# Patient Record
Sex: Male | Born: 1965 | Race: Black or African American | Hispanic: No | Marital: Single | Smoking: Current every day smoker
Health system: Southern US, Community
[De-identification: ages and names within clinical notes are randomized; demographics above are authoritative.]

## PROBLEM LIST (undated history)

## (undated) DIAGNOSIS — R220 Localized swelling, mass and lump, head: Secondary | ICD-10-CM

## (undated) DIAGNOSIS — K219 Gastro-esophageal reflux disease without esophagitis: Secondary | ICD-10-CM

## (undated) DIAGNOSIS — I10 Essential (primary) hypertension: Secondary | ICD-10-CM

## (undated) DIAGNOSIS — T7840XA Allergy, unspecified, initial encounter: Secondary | ICD-10-CM

## (undated) DIAGNOSIS — S8290XA Unspecified fracture of unspecified lower leg, initial encounter for closed fracture: Secondary | ICD-10-CM

## (undated) HISTORY — DX: Essential (primary) hypertension: I10

## (undated) HISTORY — PX: CARPAL TUNNEL RELEASE: SHX101

## (undated) HISTORY — DX: Allergy, unspecified, initial encounter: T78.40XA

---
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
## 2008-11-09 ENCOUNTER — Emergency Department (HOSPITAL_COMMUNITY): Admission: EM | Admit: 2008-11-09 | Discharge: 2008-11-09 | Payer: Self-pay | Admitting: Emergency Medicine

## 2008-12-06 ENCOUNTER — Emergency Department (HOSPITAL_COMMUNITY): Admission: EM | Admit: 2008-12-06 | Discharge: 2008-12-06 | Payer: Self-pay | Admitting: Family Medicine

## 2009-02-16 ENCOUNTER — Ambulatory Visit: Payer: Self-pay | Admitting: Physician Assistant

## 2009-02-16 DIAGNOSIS — R030 Elevated blood-pressure reading, without diagnosis of hypertension: Secondary | ICD-10-CM | POA: Insufficient documentation

## 2009-02-16 DIAGNOSIS — L723 Sebaceous cyst: Secondary | ICD-10-CM | POA: Insufficient documentation

## 2009-02-16 HISTORY — DX: Elevated blood-pressure reading, without diagnosis of hypertension: R03.0

## 2009-02-19 ENCOUNTER — Encounter: Payer: Self-pay | Admitting: Physician Assistant

## 2009-02-19 LAB — CONVERTED CEMR LAB
AST: 41 units/L — ABNORMAL HIGH (ref 0–37)
Albumin: 4.4 g/dL (ref 3.5–5.2)
Basophils Absolute: 0 10*3/uL (ref 0.0–0.1)
Benzodiazepines.: NEGATIVE
Calcium: 9.8 mg/dL (ref 8.4–10.5)
Cocaine Metabolites: POSITIVE — AB
Creatinine, Ser: 1.09 mg/dL (ref 0.40–1.50)
Eosinophils Absolute: 0.2 10*3/uL (ref 0.0–0.7)
Eosinophils Relative: 3 % (ref 0–5)
Lymphocytes Relative: 38 % (ref 12–46)
Marijuana Metabolite: POSITIVE — AB
Methadone: NEGATIVE
Monocytes Relative: 10 % (ref 3–12)
Neutro Abs: 3.4 10*3/uL (ref 1.7–7.7)
Neutrophils Relative %: 49 % (ref 43–77)
Platelets: 182 10*3/uL (ref 150–400)
Potassium: 5 meq/L (ref 3.5–5.3)
RBC: 5.05 M/uL (ref 4.22–5.81)
Sodium: 143 meq/L (ref 135–145)
TSH: 1.166 microintl units/mL (ref 0.350–4.500)
Total Bilirubin: 0.3 mg/dL (ref 0.3–1.2)

## 2009-03-12 ENCOUNTER — Telehealth: Payer: Self-pay | Admitting: Physician Assistant

## 2009-03-19 ENCOUNTER — Encounter: Payer: Self-pay | Admitting: Physician Assistant

## 2009-03-20 ENCOUNTER — Encounter (INDEPENDENT_AMBULATORY_CARE_PROVIDER_SITE_OTHER): Payer: Self-pay | Admitting: *Deleted

## 2009-04-14 ENCOUNTER — Ambulatory Visit: Payer: Self-pay | Admitting: Internal Medicine

## 2009-04-14 ENCOUNTER — Encounter: Payer: Self-pay | Admitting: Physician Assistant

## 2009-04-28 ENCOUNTER — Ambulatory Visit: Payer: Self-pay | Admitting: Physician Assistant

## 2009-04-28 DIAGNOSIS — Z9189 Other specified personal risk factors, not elsewhere classified: Secondary | ICD-10-CM | POA: Insufficient documentation

## 2009-04-28 DIAGNOSIS — B351 Tinea unguium: Secondary | ICD-10-CM | POA: Insufficient documentation

## 2009-04-28 DIAGNOSIS — F17200 Nicotine dependence, unspecified, uncomplicated: Secondary | ICD-10-CM | POA: Insufficient documentation

## 2009-04-29 LAB — CONVERTED CEMR LAB
ALT: 35 units/L (ref 0–53)
BUN: 12 mg/dL (ref 6–23)
Calcium: 9.9 mg/dL (ref 8.4–10.5)
Chloride: 103 meq/L (ref 96–112)
Cholesterol: 159 mg/dL (ref 0–200)
Glucose, Bld: 94 mg/dL (ref 70–99)
HDL goal, serum: 40 mg/dL
LDL Goal: 130 mg/dL
Potassium: 5.9 meq/L — ABNORMAL HIGH (ref 3.5–5.3)
Total Bilirubin: 0.4 mg/dL (ref 0.3–1.2)
Total CHOL/HDL Ratio: 5.3
Total Protein: 7.8 g/dL (ref 6.0–8.3)

## 2009-05-01 ENCOUNTER — Encounter (INDEPENDENT_AMBULATORY_CARE_PROVIDER_SITE_OTHER): Payer: Self-pay | Admitting: *Deleted

## 2009-05-04 ENCOUNTER — Ambulatory Visit: Payer: Self-pay | Admitting: Physician Assistant

## 2009-05-04 LAB — CONVERTED CEMR LAB

## 2009-05-05 ENCOUNTER — Encounter (INDEPENDENT_AMBULATORY_CARE_PROVIDER_SITE_OTHER): Payer: Self-pay | Admitting: *Deleted

## 2009-05-05 LAB — CONVERTED CEMR LAB: Potassium: 4.4 meq/L (ref 3.5–5.3)

## 2009-09-12 ENCOUNTER — Emergency Department (HOSPITAL_COMMUNITY): Admission: EM | Admit: 2009-09-12 | Discharge: 2009-09-12 | Payer: Self-pay | Admitting: Emergency Medicine
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
## 2010-03-17 NOTE — Assessment & Plan Note (Signed)
Summary: F/U/FASTING   Vital Signs:  Patient profile:   45 year old male Height:      64.25 inches Weight:      259 pounds BMI:     44.27 Temp:     98.3 degrees F oral Pulse rate:   83 / minute Pulse rhythm:   regular Resp:     18 per minute BP sitting:   123 / 89  (left arm) Cuff size:   large  Vitals Entered By: Arm[place] Shannon (April 28, 2009 3:55 PM) CC: cpe.... Is Patient Diabetic? No Pain Assessment Patient in pain? no       Does patient need assistance? Functional Status Self care Ambulation Normal   Primary Care Provider:  Tereso Newcomer, PA-C  CC:  cpe.....  History of Present Illness: Here for CPE.  Very upset for waiting so long. Put in 15 min slot. Not checked in at right time  Epidermoid cyst on scalp:  Saw derm clinic at [facility].  Did not like the dermatologist he saw.  She tried to discourage him from having removed.  However, he wants it excised.  Also has h/o tinea pedis.  Saw a foot doctor in past and had some type of surgery.  Has tried multiple creams, etc. without relief.  Pruritic.    Allergies: No Known Drug Allergies  Past History:  Past Medical History: Last updated: 02/19/2009 Unremarkable Substance Abuse   a.  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 CV:  Denies chest pain or  discomfort, fainting, and shortness of breath with exertion. Resp:  Denies cough. GI:  Denies bloody stools and dark tarry stools. GU:  Denies dysuria, hematuria, nocturia, urinary frequency, and urinary hesitancy. MS:  Denies joint pain. Derm:  bilat tinea bilat thickened yellow nails. Psych:  Denies depression. Endo:  Denies cold intolerance.  Physical Exam  General:  alert, well-developed, and well-nourished.   Head:  normocephalic and atraumatic.   Eyes:  pupils equal, pupils round, pupils reactive to light, and no retinal abnormalitiies.   Ears:  R ear normal and L ear normal.   Nose:  no external deformity.   Mouth:  pharynx pink and moist, no erythema, and no exudates.   Neck:  supple, no thyromegaly, no carotid bruits, and no cervical lymphadenopathy.   Chest Wall:  no deformities.   Breasts:  no gynecomastia.   Lungs:  normal breath sounds, no crackles, and no wheezes.   Heart:  normal rate, regular rhythm, and no murmur.   Abdomen:  soft, non-tender, normal bowel sounds, and no hepatomegaly.   Rectal:  no external abnormalities.   Genitalia:  circumcised, no hydrocele, no varicocele, no scrotal masses, no testicular masses or atrophy, no cutaneous lesions, and no urethral discharge.   Msk:  normal ROM.    Pulses:  DP/PT 2+ bilat Extremities:  no edema  Neurologic:  alert & oriented X3 and cranial nerves II-XII intact.   Skin:  large epidermoid cyst noted right frontal area  bilat foot scaling c/w tinea bilat toenails with onychomycosis  Psych:  normally interactive and good eye contact.     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 Take 1 tablet by mouth once a day  Problem # 5:  PREVENTIVE HEALTH CARE (ICD-V70.0)  check lipids  Orders: T-Lipid Profile ([id number])  Complete Medication List: 1)  Lamisil 250 Mg Tabs (Terbinafine hcl) .... Take 1 tablet by mouth once a day   Patient Instructions: 1)  Need HIV results from last visit. 2)  Return to clinic in 8 weeks for hepatic function panel (Dx :110.1, V58.69) Prescriptions: LAMISIL 250 MG TABS (TERBINAFINE HCL) Take 1 tablet by mouth once a day  #30 x 2   Entered and Authorized by:   Tereso Newcomer PA-C   Signed by:   Tereso Newcomer PA-C on 04/28/2009   Method used:   Print then Give to Patient   RxID:   [id number]

## 2010-03-17 NOTE — Letter (Signed)
Summary: *HSN Results Follow up  [facility]  [address] [place], [place] [address]   Phone: [phone]  Fax: [phone]      05/05/2009   Richard Sexton [address] [place], [place]  [address]   Dear  Mr. [name],                            ____S.Drinkard,FNP   ____D. Gore,FNP       ____B. McPherson,MD   ____V. Rankins,MD    ____E. Mulberry,MD    ____N. Daphine Deutscher, FNP  ____D. Reche Dixon, MD    ____K. Philipp Deputy, MD    ____Other     This letter is to inform you that your recent test(s):  _______Pap Smear    __X_____Lab Test     _______X-ray    ___X____ is within acceptable limits  _______ requires a medication change  _______ requires a follow-up lab visit  _______ requires a follow-up visit with your provider   Comments:       _________________________________________________________ If you have any questions, please contact our office                     Sincerely,  Armenia Shannon [facility]

## 2010-03-17 NOTE — Letter (Signed)
Summary: *HSN Results Follow up  [facility]  [address] [place], [place] [address]   Phone: [phone]  Fax: [phone]      05/01/2009   EAMON TANTILLO [address] [place], [place]  [address]   Dear  Mr. HASSAN BLACKSHIRE,                            ____S.Drinkard,FNP   ____D. Gore,FNP       ____B. McPherson,MD   ____V. Rankins,MD    ____E. Mulberry,MD    ____N. Daphine Deutscher, FNP  ____D. Reche Dixon, MD    ____K. Philipp Deputy, MD    ____Other     This letter is to inform you that your recent test(s):  _______Pap Smear    ___X____Lab Test     _______X-ray    _______ is within acceptable limits  __X_____ requires a medication change  ___X____ requires a follow-up lab visit  _______ requires a follow-up visit with your provider   Comments:  We have been trying to contact you.  Please give the office a call at your earliest convenience.       _________________________________________________________ If you have any questions, please contact our office                     Sincerely,  Armenia Shannon [facility]

## 2010-03-17 NOTE — Progress Notes (Signed)
Summary: Derm referral update  Phone Note From Other Clinic   Caller: Referral Coordinator Reason for Call: Schedule Patient Appt Summary of Call: None of the numbers in IDX work for the Pt.I would suggest that when & if  the Pt comes in to see you in March that he be scheduled in [facility] Derm clinic for a consult.It would be more cost effective for the Pt.Let me know what you think about this. Initial call taken by: Candi Leash,  March 12, 2009 10:16 AM  Follow-up for Phone Call        Please send letter for patient to get in touch with [place].   Follow-up by: Tereso Newcomer PA-C,  March 12, 2009 10:36 AM  Additional Follow-up for Phone Call Additional follow up Details #1::        i don't this would be a problem for the pt.... i callled the above number and left a message...Marland KitchenMarland KitchenArmenia Shannon  March 12, 2009 11:15 AM  mailing letter.Armenia Shannon  March 20, 2009 9:29 AM

## 2010-03-17 NOTE — Letter (Signed)
Summary: *HSN Results Follow up  [facility]  [address] [place], [place] [address]   Phone: [phone]  Fax: [phone]      02/19/2009   Richard Sexton [address] [place], [place]  [address]   Dear  Mr. Richard Sexton,                            ____S.Drinkard,FNP   ____D. Gore,FNP       ____B. McPherson,MD   ____V. Rankins,MD    ____E. Mulberry,MD    ____N. Daphine Deutscher, FNP  ____D. Reche Dixon, MD    ____K. Philipp Deputy, MD    __x__S. Alben Spittle, PA-C     This letter is to inform you that your recent test(s):  _______Pap Smear    ____x___Lab Test     _______X-ray    ___x____ is within acceptable limits  _______ requires a medication change  _______ requires a follow-up lab visit  ___x____ requires a follow-up visit with your provider   Comments:  Make sure you keep your appointment for your physical exam.       _________________________________________________________ If you have any questions, please contact our office                     Sincerely,  Tereso Newcomer PA-C [facility]

## 2010-03-17 NOTE — Letter (Signed)
Summary: SLIDING SCALE FEE  SLIDING SCALE FEE   Imported By: Arta Bruce 04/27/2009 16:44:45  _____________________________________________________________________  External Attachment:    Type:   Image     Comment:   External Document

## 2010-03-17 NOTE — Letter (Signed)
Summary: DERMATOLOGY  DERMATOLOGY   Imported By: Arta Bruce 07/06/2009 15:54:09  _____________________________________________________________________  External Attachment:    Type:   Image     Comment:   External Document

## 2010-03-17 NOTE — Letter (Signed)
Summary: PT INFORMATION SHEET  PT INFORMATION SHEET   Imported By: Arta Bruce 04/27/2009 16:14:31  _____________________________________________________________________  External Attachment:    Type:   Image     Comment:   External Document

## 2010-03-17 NOTE — Letter (Signed)
Summary: *HSN Results Follow up  [facility]  [address] [place], [place] [address]   Phone: [phone]  Fax: [phone]      03/20/2009   Richard Sexton [address] [place], [place]  [address]   Dear  Mr. Richard Sexton,                            ____S.Drinkard,FNP   ____D. Gore,FNP       ____B. McPherson,MD   ____V. Rankins,MD    ____E. Mulberry,MD    ____N. Daphine Deutscher, FNP  ____D. Reche Dixon, MD    ____K. Philipp Deputy, MD    ____Other     This letter is to inform you that your recent test(s):  _______Pap Smear    _______Lab Test     _______X-ray    _______ is within acceptable limits  _______ requires a medication change  _______ requires a follow-up lab visit  _______ requires a follow-up visit with your provider   Comments:  We have been trying to contact you.  Please give the office a call at your earliest convenience.       _________________________________________________________ If you have any questions, please contact our office                     Sincerely,  Richard Sexton [facility]

## 2010-03-17 NOTE — Letter (Signed)
Summary: REFERRAL/DERMATOLOGY  REFERRAL/DERMATOLOGY   Imported By: Arta Bruce 07/06/2009 16:04:56  _____________________________________________________________________  External Attachment:    Type:   Image     Comment:   External Document

## 2010-05-21 LAB — URINALYSIS, ROUTINE W REFLEX MICROSCOPIC
Bilirubin Urine: NEGATIVE
Glucose, UA: NEGATIVE mg/dL
Ketones, ur: NEGATIVE mg/dL
Nitrite: POSITIVE — AB
Protein, ur: 30 mg/dL — AB
Specific Gravity, Urine: 1.019 (ref 1.005–1.030)
Urobilinogen, UA: 2 mg/dL — ABNORMAL HIGH (ref 0.0–1.0)
pH: 7 (ref 5.0–8.0)

## 2010-05-21 LAB — URINE CULTURE: Colony Count: >=100000

## 2010-05-21 LAB — URINE MICROSCOPIC-ADD ON

## 2013-06-01 ENCOUNTER — Emergency Department (HOSPITAL_COMMUNITY)
Admission: EM | Admit: 2013-06-01 | Discharge: 2013-06-01 | Disposition: A | Discharge status: Home / Self Care | Payer: No Typology Code available for payment source | Attending: Emergency Medicine | Admitting: Emergency Medicine

## 2013-06-01 ENCOUNTER — Encounter (HOSPITAL_COMMUNITY): Payer: Self-pay | Admitting: Emergency Medicine

## 2013-06-01 DIAGNOSIS — F17200 Nicotine dependence, unspecified, uncomplicated: Secondary | ICD-10-CM | POA: Insufficient documentation

## 2013-06-01 DIAGNOSIS — W57XXXA Bitten or stung by nonvenomous insect and other nonvenomous arthropods, initial encounter: Secondary | ICD-10-CM

## 2013-06-01 DIAGNOSIS — Z23 Encounter for immunization: Secondary | ICD-10-CM | POA: Insufficient documentation

## 2013-06-01 DIAGNOSIS — R000 Tachycardia, unspecified: Secondary | ICD-10-CM | POA: Insufficient documentation

## 2013-06-01 DIAGNOSIS — Y9389 Activity, other specified: Secondary | ICD-10-CM | POA: Insufficient documentation

## 2013-06-01 DIAGNOSIS — IMO0001 Reserved for inherently not codable concepts without codable children: Secondary | ICD-10-CM | POA: Insufficient documentation

## 2013-06-01 DIAGNOSIS — Y9289 Other specified places as the place of occurrence of the external cause: Secondary | ICD-10-CM | POA: Insufficient documentation

## 2013-06-01 MED ORDER — HYDROCORTISONE 1 % EX CREA: TOPICAL_CREAM | CUTANEOUS | Status: DC

## 2013-06-01 MED ORDER — CETIRIZINE HCL 10 MG PO TABS: 10.0000 mg | ORAL_TABLET | Freq: Every day | ORAL | Status: DC

## 2013-06-01 MED ORDER — DIPHENHYDRAMINE HCL 25 MG PO CAPS
25.0000 mg | ORAL_CAPSULE | Freq: Once | ORAL | Status: AC
  Administered 2013-06-01: 25 mg via ORAL
  Filled 2013-06-01: qty 1

## 2013-06-01 MED ORDER — TETANUS-DIPHTH-ACELL PERTUSSIS 5-2.5-18.5 LF-MCG/0.5 IM SUSP
0.5000 mL | Freq: Once | INTRAMUSCULAR | Status: AC
  Administered 2013-06-01: 0.5 mL via INTRAMUSCULAR
  Filled 2013-06-01: qty 0.5

## 2013-06-01 NOTE — ED Provider Notes (Signed)
CSN: [id number]     Arrival date & time 06/01/13  1746 History  This chart was scribed for non-physician practitioner working with Shaune Pollack, MD by Mercy Moore, ED Scribe. This patient was seen in room TR07C/TR07C and the patient's care was started at 6:05 PM.   Chief Complaint  Patient presents with  . Insect Bite      The history is provided by the patient. No language interpreter was used.   HPI Comments: HPI Comments: Richard Sexton is a 48 y.o. male brought in by ambulance, who presents to the Emergency Department with an insect bit on his left forearm. Patient was bitten 30 minutes prior to arrival to the ED while [place] a [employer] bus. Patient reports relaxing on the bus with his arm resting on a seat and being bitten on the left forearm. Patient is unsure of the type of insect that bit him. Patient is uncertain of last Tetanus and is due for an update.   History reviewed. No pertinent past medical history. History reviewed. No pertinent past surgical history. No family history on file. History  Substance Use Topics  . Smoking status: Current Every Day Smoker  . Smokeless tobacco: Not on file  . Alcohol Use: Yes    Review of Systems  Constitutional: Negative for fever and chills.  Respiratory: Negative for cough, shortness of breath and wheezing.   Skin: Positive for rash.       Insect bite.       Allergies  Review of patient's allergies indicates no known allergies.  Home Medications   Prior to Admission medications   Not on File   Triage Vitals: BP 136/96  Pulse 109  Temp(Src) 99.4 F (37.4 C) (Oral)  Resp 18  Ht 5\' 6"  (1.676 m)  Wt 247 lb (112.038 kg)  BMI 39.89 kg/m2  SpO2 98% Physical Exam  Nursing note and vitals reviewed. Constitutional: He is oriented to person, place, and time. He appears well-developed and well-nourished. No distress.  HENT:  Head: Normocephalic and atraumatic.  No swelling, drooling, or stridor, airway is patent  Eyes: EOM  are normal.  Neck: Neck supple. No tracheal deviation present.  Cardiovascular:  Mildly tachycardic  Pulmonary/Chest: Effort normal. No respiratory distress. He has no wheezes.  Musculoskeletal: Normal range of motion.  Neurological: He is alert and oriented to person, place, and time.  Skin: Skin is warm and dry.  3 bug bites on left anterior arm, no discharge, no evidence of cellulitis  Psychiatric: He has a normal mood and affect. His behavior is normal.    ED Course  Procedures (including critical care time) DIAGNOSTIC STUDIES: Oxygen Saturation is 98% on room air, normal by my interpretation.    COORDINATION OF CARE: 6:05 PM- Advised to treat bit with Benadryl, Claritin, and/or hydrocortisone cream. Patient advised to revisit ED if the bit spreads, begins to drains or appears infected. Discussed treatment plan with patient at bedside and patient agreed to plan.    Labs Review Labs Reviewed - No data to display  Imaging Review No results found.   EKG Interpretation None      MDM   Final diagnoses:  Insect bite    Patient with rash/insect bite.  Will treat with benadryl, zyrtec, and hydrocortisone.  Return precautions given regarding infections.  Patient is stable and ready for discharge.  I personally performed the services described in this documentation, which was scribed in my presence. The recorded information has been reviewed and is accurate.  Montine Circle, PA-C 06/01/13 1840

## 2013-06-01 NOTE — Discharge Instructions (Signed)
Insect Bite  Mosquitoes, flies, fleas, bedbugs, and many other insects can bite. Insect bites are different from insect stings. A sting is when venom is injected into the skin. Some insect bites can transmit infectious diseases.  SYMPTOMS   Insect bites usually turn red, swell, and itch for 2 to 4 days. They often go away on their own.  TREATMENT   Your caregiver may prescribe antibiotic medicines if a bacterial infection develops in the bite.  HOME CARE INSTRUCTIONS   Do not scratch the bite area.   Keep the bite area clean and dry. Wash the bite area thoroughly with soap and water.   Put ice or cool compresses on the bite area.   Put ice in a plastic bag.   Place a towel between your skin and the bag.   Leave the ice on for 20 minutes, 4 times a day for the first 2 to 3 days, or as directed.   You may apply a baking soda paste, cortisone cream, or calamine lotion to the bite area as directed by your caregiver. This can help reduce itching and swelling.   Only take over-the-counter or prescription medicines as directed by your caregiver.   If you are given antibiotics, take them as directed. Finish them even if you start to feel better.  You may need a tetanus shot if:   You cannot remember when you had your last tetanus shot.   You have never had a tetanus shot.   The injury broke your skin.  If you get a tetanus shot, your arm may swell, get red, and feel warm to the touch. This is common and not a problem. If you need a tetanus shot and you choose not to have one, there is a rare chance of getting tetanus. Sickness from tetanus can be serious.  SEEK IMMEDIATE MEDICAL CARE IF:    You have increased pain, redness, or swelling in the bite area.   You see a red line on the skin coming from the bite.   You have a fever.   You have joint pain.   You have a headache or neck pain.   You have unusual weakness.   You have a rash.   You have chest pain or shortness of breath.    You have abdominal pain, nausea, or vomiting.   You feel unusually tired or sleepy.  MAKE SURE YOU:    Understand these instructions.   Will watch your condition.   Will get help right away if you are not doing well or get worse.  Document Released: 03/10/2004 Document Revised: 04/25/2011 Document Reviewed: 09/01/2010  [employer] Patient Information 2014 [employer].

## 2013-06-01 NOTE — ED Notes (Signed)
Received pt via EMS with c/o Pt bit by insect on left forearm onset today about 30 mins PTA.

## 2013-06-02 NOTE — ED Provider Notes (Signed)
History/physical exam/procedure(s) were performed by non-physician practitioner and as supervising physician I was immediately available for consultation/collaboration. I have reviewed all notes and am in agreement with care and plan.   Shaune Pollack, MD 06/02/13 [phone]

## 2013-08-20 ENCOUNTER — Ambulatory Visit: Payer: No Typology Code available for payment source | Admitting: Internal Medicine

## 2013-08-20 DIAGNOSIS — Z0289 Encounter for other administrative examinations: Secondary | ICD-10-CM

## 2014-11-20 ENCOUNTER — Encounter (HOSPITAL_COMMUNITY): Payer: Self-pay | Admitting: Emergency Medicine

## 2014-11-20 ENCOUNTER — Emergency Department (HOSPITAL_COMMUNITY)
Admission: EM | Admit: 2014-11-20 | Discharge: 2014-11-20 | Disposition: A | Discharge status: Home / Self Care | Payer: No Typology Code available for payment source | Attending: Emergency Medicine | Admitting: Emergency Medicine

## 2014-11-20 DIAGNOSIS — Z79899 Other long term (current) drug therapy: Secondary | ICD-10-CM | POA: Insufficient documentation

## 2014-11-20 DIAGNOSIS — Z720 Tobacco use: Secondary | ICD-10-CM | POA: Insufficient documentation

## 2014-11-20 DIAGNOSIS — L03115 Cellulitis of right lower limb: Secondary | ICD-10-CM | POA: Insufficient documentation

## 2014-11-20 DIAGNOSIS — B353 Tinea pedis: Secondary | ICD-10-CM | POA: Insufficient documentation

## 2014-11-20 LAB — CBG MONITORING, ED: Glucose-Capillary: 107 mg/dL — ABNORMAL HIGH (ref 65–99)

## 2014-11-20 MED ORDER — DOXYCYCLINE HYCLATE 100 MG PO CAPS: 100.0000 mg | ORAL_CAPSULE | Freq: Two times a day (BID) | ORAL | Status: DC

## 2014-11-20 MED ORDER — CLOTRIMAZOLE-BETAMETHASONE 1-0.05 % EX CREA: TOPICAL_CREAM | CUTANEOUS | Status: DC

## 2014-11-20 NOTE — Discharge Instructions (Signed)
Athlete's Foot Athlete's foot (tinea pedis) is a fungal infection of the skin on the feet. It often occurs on the skin between the toes or underneath the toes. It can also occur on the soles of the feet. Athlete's foot is more likely to occur in hot, humid weather. Not washing your feet or changing your socks often enough can contribute to athlete's foot. The infection can spread from person to person (contagious). CAUSES Athlete's foot is caused by a fungus. This fungus thrives in warm, moist places. Most people get athlete's foot by sharing shower stalls, towels, and wet floors with an infected person. People with weakened immune systems, including those with diabetes, may be more likely to get athlete's foot. SYMPTOMS   Itchy areas between the toes or on the soles of the feet.  White, flaky, or scaly areas between the toes or on the soles of the feet.  Tiny, intensely itchy blisters between the toes or on the soles of the feet.  Tiny cuts on the skin. These cuts can develop a bacterial infection.  Thick or discolored toenails. DIAGNOSIS  Your caregiver can usually tell what the problem is by doing a physical exam. Your caregiver may also take a skin sample from the rash area. The skin sample may be examined under a microscope, or it may be tested to see if fungus will grow in the sample. A sample may also be taken from your toenail for testing. TREATMENT  Over-the-counter and prescription medicines can be used to kill the fungus. These medicines are available as powders or creams. Your caregiver can suggest medicines for you. Fungal infections respond slowly to treatment. You may need to continue using your medicine for several weeks. PREVENTION   Do not share towels.  Wear sandals in wet areas, such as shared locker rooms and shared showers.  Keep your feet dry. Wear shoes that allow air to circulate. Wear cotton or wool socks. HOME CARE INSTRUCTIONS   Take medicines as directed by  your caregiver. Do not use steroid creams on athlete's foot.  Keep your feet clean and cool. Wash your feet daily and dry them thoroughly, especially between your toes.  Change your socks every day. Wear cotton or wool socks. In hot climates, you may need to change your socks 2 to 3 times per day.  Wear sandals or canvas tennis shoes with good air circulation.  If you have blisters, soak your feet in Burow's solution or Epsom salts for 20 to 30 minutes, 2 times a day to dry out the blisters. Make sure you dry your feet thoroughly afterward. SEEK MEDICAL CARE IF:   You have a fever.  You have swelling, soreness, warmth, or redness in your foot.  You are not getting better after 7 days of treatment.  You are not completely cured after 30 days.  You have any problems caused by your medicines. MAKE SURE YOU:   Understand these instructions.  Will watch your condition.  Will get help right away if you are not doing well or get worse.   This information is not intended to replace advice given to you by your health care provider. Make sure you discuss any questions you have with your health care provider.   Document Released: 01/29/2000 Document Revised: 04/25/2011 Document Reviewed: 08/04/2014 Elsevier Interactive Patient Education 2016 [place] in warm salt water daily. Keep feet dry. Prescriptions for ointment and antibiotic. Elevate feet.

## 2014-11-20 NOTE — ED Notes (Addendum)
CBG 107mg /dL

## 2014-11-20 NOTE — ED Notes (Signed)
Declined W/C at D/C and was escorted to lobby by RN. 

## 2014-11-20 NOTE — ED Provider Notes (Signed)
CSN: [id number]     Arrival date & time 11/20/14  1048 History   By signing my name below, I, Erling Conte, attest that this documentation has been prepared under the direction and in the presence of No att. providers found. Electronically Signed: Erling Conte, ED Scribe. 11/20/2014. 3:34 PM.    Chief Complaint  Patient presents with  . Foot Pain  . Wound Check    The history is provided by the patient. No language interpreter was used.    HPI Comments: Richard Sexton is a 49 y.o. male with h/o athletes foot who presents to the Emergency Department complaining of constant, right, pinky toe pain located between his 4th and 5th toes onset several days. He states he has associated flaky rash and abrasion between all his toes but that it is worse between the right 4th and 5th toe. He reports he applied athlete's foot cream to the area with no relief. He denies h/o diabetes and states that he has has had his sugar checked in the past. 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 Pt denies any other complaints at this time.    History reviewed. No pertinent past medical history. History reviewed. No pertinent past surgical history. History reviewed. No pertinent family history. Social History  Substance Use Topics  . Smoking status: Current Every Day Smoker  . Smokeless tobacco: None  . Alcohol Use: Yes    Review of Systems 10 Systems reviewed and all are negative for acute change except as noted in the HPI.     Allergies  Bactrim  Home Medications   Prior to Admission medications   Medication Sig Start Date End Date Taking? Authorizing Provider  cetirizine (ZYRTEC ALLERGY) 10 MG tablet Take 1 tablet (10 mg total) by mouth daily. 06/01/13   Montine Circle, PA-C  clotrimazole-betamethasone (LOTRISONE) cream Apply to affected area 2 times daily prn 11/20/14   Nat Christen, MD  doxycycline (VIBRAMYCIN) 100 MG  capsule Take 1 capsule (100 mg total) by mouth 2 (two) times daily. 11/20/14   Nat Christen, MD  hydrocortisone cream 1 % Apply to affected area 2 times daily 06/01/13   Montine Circle, PA-C   Triage Vitals: BP 141/86 mmHg  Pulse 117  Temp(Src) 99.5 F (37.5 C) (Oral)  Resp 17  Ht 5\' 7"  (1.702 m)  Wt 238 lb (107.956 kg)  BMI 37.27 kg/m2  SpO2 96%  Physical Exam  Constitutional: He is oriented to person, place, and time. He appears well-developed and well-nourished.  HENT:  Head: Normocephalic and atraumatic.  Eyes: Conjunctivae and EOM are normal. Pupils are equal, round, and reactive to light.  Neck: Normal range of motion. Neck supple.  Cardiovascular: Normal rate and regular rhythm.   Pulmonary/Chest: Effort normal and breath sounds normal.  Abdominal: Soft. Bowel sounds are normal.  Musculoskeletal: Normal range of motion.  Tender along the baby toe of right foot around the MTP joint on the plantar aspect and also the lateral aspect of baby toe.  He has a flaky excoriation between all toes but worse along right 4th and 5th digit.  Neurological: He is alert and oriented to person, place, and time.  Skin: Skin is warm and dry.  Psychiatric: He has a normal mood and affect. His behavior is normal.  Nursing note and vitals reviewed.   ED Course  Procedures (including critical care time)  DIAGNOSTIC STUDIES: Oxygen Saturation is 96%  on RA, normal by my interpretation.    COORDINATION OF CARE:  12:04 PM- Will order CBG. Pt advised of plan for treatment and pt agrees.    Labs Review Labs Reviewed  CBG MONITORING, ED - Abnormal; Notable for the following:    Glucose-Capillary 107 (*)    All other components within normal limits    Imaging Review No results found. I have personally reviewed and evaluated these lab results as part of my medical decision-making.   EKG Interpretation None      MDM   Final diagnoses:  Cellulitis of right foot  Tinea pedis of right  foot   History of physical consistent with cellulitis of his right foot and tinea pedis. Discharge medications Lotrisone cream and doxycycline 100 mg.  Glucose normal.  I, Francee Setzer, personally performed the services described in this documentation. All medical record entries made by the scribe were at my direction and in my presence.  I have reviewed the chart and discharge instructions and agree that the record reflects my personal performance and is accurate and complete. Anely Spiewak.  11/20/2014. 3:34 PM.     Nat Christen, MD 11/20/14 1535

## 2014-11-20 NOTE — ED Notes (Signed)
Pt c/o right pinky toe pain and wound x several days; pt sts hx of athletes foot

## 2014-12-04 ENCOUNTER — Encounter (HOSPITAL_COMMUNITY): Payer: Self-pay | Admitting: *Deleted

## 2014-12-04 ENCOUNTER — Emergency Department (HOSPITAL_COMMUNITY)
Admission: EM | Admit: 2014-12-04 | Discharge: 2014-12-04 | Disposition: A | Discharge status: Home / Self Care | Payer: No Typology Code available for payment source | Attending: Physician Assistant | Admitting: Physician Assistant

## 2014-12-04 DIAGNOSIS — L089 Local infection of the skin and subcutaneous tissue, unspecified: Secondary | ICD-10-CM | POA: Insufficient documentation

## 2014-12-04 DIAGNOSIS — Z79899 Other long term (current) drug therapy: Secondary | ICD-10-CM | POA: Insufficient documentation

## 2014-12-04 DIAGNOSIS — Z720 Tobacco use: Secondary | ICD-10-CM | POA: Insufficient documentation

## 2014-12-04 DIAGNOSIS — S8290XA Unspecified fracture of unspecified lower leg, initial encounter for closed fracture: Secondary | ICD-10-CM | POA: Insufficient documentation

## 2014-12-04 DIAGNOSIS — Z87828 Personal history of other (healed) physical injury and trauma: Secondary | ICD-10-CM | POA: Insufficient documentation

## 2014-12-04 HISTORY — DX: Unspecified fracture of unspecified lower leg, initial encounter for closed fracture: S82.90XA

## 2014-12-04 MED ORDER — CEPHALEXIN 500 MG PO CAPS: 500.0000 mg | ORAL_CAPSULE | Freq: Four times a day (QID) | ORAL | Status: DC

## 2014-12-04 NOTE — ED Provider Notes (Signed)
CSN: [id number]     Arrival date & time 12/04/14  7062 History  By signing my name below, I, Erling Conte, attest that this documentation has been prepared under the direction and in the presence of Delos Haring, PA-C Electronically Signed: Erling Conte, ED Scribe. 12/04/2014. 10:20 AM.    Chief Complaint  Patient presents with  . Foot Problem   The history is provided by the patient and medical records. No language interpreter was used.    HPI Comments: Richard Sexton is a 49 y.o. male who presents to the Emergency Department complaining of constant, right toe pain located between his 4th and 5th toes onset 3 weeks. He reports associated flaky rash and mild swelling. Pt was seen for the same 2 weeks ago and was dx with tinea pedia and given rx for doxycyline and Lotrisone cream. Pt reports he was unable to get his doxycycline rx filled because it was too expensive. He has been applying the Lotrisone cream with no relief for his symptoms. Pt's glucose levels were checked 2 weeks ago and came back normal. 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 He does not have a PCP at this time and is requesting a referral to [facility]. No fever and no worsening pain. He denies any other complaints at this time.  Past Medical History  Diagnosis Date  . Broken leg 1975    no surgery ,PT was casted - LT leg   Past Surgical History  Procedure Laterality Date  . Leg surgery Right 1986    Femur shorten   History reviewed. No pertinent family history. Social History  Substance Use Topics  . Smoking status: Current Every Day Smoker  . Smokeless tobacco: Never Used  . Alcohol Use: Yes     Comment: social    Review of Systems  Skin: Positive for color change and rash.  All other systems reviewed and are negative.  Allergies  Bactrim  Home Medications   Prior to Admission medications   Medication Sig  Start Date End Date Taking? Authorizing Provider  cephALEXin (KEFLEX) 500 MG capsule Take 1 capsule (500 mg total) by mouth 4 (four) times daily. 12/04/14   Shaquela Weichert Carlota Raspberry, PA-C  cetirizine (ZYRTEC ALLERGY) 10 MG tablet Take 1 tablet (10 mg total) by mouth daily. 06/01/13   Montine Circle, PA-C  clotrimazole-betamethasone (LOTRISONE) cream Apply to affected area 2 times daily prn 11/20/14   Nat Christen, MD  doxycycline (VIBRAMYCIN) 100 MG capsule Take 1 capsule (100 mg total) by mouth 2 (two) times daily. 11/20/14   Nat Christen, MD  hydrocortisone cream 1 % Apply to affected area 2 times daily 06/01/13   Montine Circle, PA-C   Triage Vitals: BP 136/97 mmHg  Pulse 93  Temp(Src) 98.6 F (37 C) (Oral)  Resp 12  SpO2 99%  Physical Exam  Constitutional: He is oriented to person, place, and time. He appears well-developed and well-nourished. No distress.  HENT:  Head: Normocephalic and atraumatic.  Eyes: Conjunctivae and EOM are normal.  Neck: Neck supple. No tracheal deviation present.  Cardiovascular: Normal rate.   Pulmonary/Chest: Effort normal. No respiratory distress.  Musculoskeletal: Normal range of motion.  Tender along the little toe of right foot around the MTP joint on the plantar and lateral aspect   Neurological: He is alert and oriented to person, place, and time.  Skin: Skin is warm and dry.  He has a flaky excoriation between all toes that is greater along right 4th and 5th digit.   Psychiatric: He has a normal mood and affect. His behavior is normal.  Nursing note and vitals reviewed.   ED Course  Procedures (including critical care time)  DIAGNOSTIC STUDIES: Oxygen Saturation is 99% on RA, normal by my interpretation.    COORDINATION OF CARE:  10:18 AM- Will d/c pt with rx for Keflex and confirmed it is on the [facility] $4 list. He was unable to afford the Doxy even with a coupon for [facility]. I had planned to write him for Bactrim but he is allergic. Given referral to  [facility].  Will also provide with referral to [facility]. Pt advised of plan for treatment and pt agrees.    Labs Review Labs Reviewed - No data to display  Imaging Review No results found.    EKG Interpretation None      MDM   Final diagnoses:  Right foot infection   Medications - No data to display  49 y.o.Richard Sexton medical screening exam was performed and I feel the patient has had an appropriate workup for their chief complaint at this time and likelihood of emergent condition existing is low. They have been counseled on decision, discharge, follow up and which symptoms necessitate immediate return to the emergency department. They or their family verbally stated understanding and agreement with plan and discharged in stable condition.   Vital signs are stable at discharge. Filed Vitals:   12/04/14 1016  BP: 140/99  Pulse: 87  Temp: 98.6 F (37 C)  Resp: 14    I personally performed the services described in this documentation, which was scribed in my presence. The recorded information has been reviewed and is accurate.    Delos Haring, PA-C 12/04/14 [place], MD 12/04/14 1621

## 2014-12-04 NOTE — ED Notes (Signed)
Declined W/C at D/C and was escorted to lobby by RN. 

## 2014-12-04 NOTE — Discharge Instructions (Signed)

## 2014-12-04 NOTE — ED Notes (Signed)
PT reports he could not afford to buy the doxycyline from last visit but has used the cream . Pt comes to day because foot problem has not improved. Pt also request  A referral to [facility].

## 2016-12-22 ENCOUNTER — Other Ambulatory Visit: Payer: Self-pay

## 2016-12-22 ENCOUNTER — Encounter (HOSPITAL_COMMUNITY): Payer: Self-pay | Admitting: *Deleted

## 2016-12-22 ENCOUNTER — Emergency Department (HOSPITAL_COMMUNITY)
Admission: EM | Admit: 2016-12-22 | Discharge: 2016-12-22 | Disposition: A | Discharge status: Home / Self Care | Payer: No Typology Code available for payment source | Attending: Physician Assistant | Admitting: Physician Assistant

## 2016-12-22 DIAGNOSIS — Z77098 Contact with and (suspected) exposure to other hazardous, chiefly nonmedicinal, chemicals: Secondary | ICD-10-CM | POA: Diagnosis not present

## 2016-12-22 DIAGNOSIS — R0602 Shortness of breath: Secondary | ICD-10-CM | POA: Diagnosis present

## 2016-12-22 DIAGNOSIS — R05 Cough: Secondary | ICD-10-CM | POA: Diagnosis not present

## 2016-12-22 DIAGNOSIS — F17210 Nicotine dependence, cigarettes, uncomplicated: Secondary | ICD-10-CM | POA: Insufficient documentation

## 2016-12-22 DIAGNOSIS — Z79899 Other long term (current) drug therapy: Secondary | ICD-10-CM | POA: Insufficient documentation

## 2016-12-22 MED ORDER — IBUPROFEN 400 MG PO TABS
600.0000 mg | ORAL_TABLET | Freq: Once | ORAL | Status: AC
  Administered 2016-12-22: 600 mg via ORAL
  Filled 2016-12-22: qty 1

## 2016-12-22 MED ORDER — ALBUTEROL SULFATE (2.5 MG/3ML) 0.083% IN NEBU
INHALATION_SOLUTION | RESPIRATORY_TRACT | Status: AC
  Administered 2016-12-22: 15:00:00 via RESPIRATORY_TRACT
  Filled 2016-12-22: qty 6

## 2016-12-22 MED ORDER — IPRATROPIUM BROMIDE 0.02 % IN SOLN
RESPIRATORY_TRACT | Status: AC
  Administered 2016-12-22: 0.5 mg via RESPIRATORY_TRACT
  Filled 2016-12-22: qty 2.5

## 2016-12-22 MED ORDER — PREDNISONE 20 MG PO TABS
50.0000 mg | ORAL_TABLET | Freq: Once | ORAL | Status: AC
  Administered 2016-12-22: 50 mg via ORAL
  Filled 2016-12-22: qty 3

## 2016-12-22 MED ORDER — PREDNISONE 10 MG PO TABS: 50.0000 mg | ORAL_TABLET | Freq: Every day | ORAL | 0 refills | Status: AC

## 2016-12-22 MED ORDER — ALBUTEROL SULFATE (2.5 MG/3ML) 0.083% IN NEBU
2.5000 mg | INHALATION_SOLUTION | Freq: Once | RESPIRATORY_TRACT | Status: AC
  Administered 2016-12-22: 2.5 mg via RESPIRATORY_TRACT

## 2016-12-22 MED ORDER — ALBUTEROL SULFATE HFA 108 (90 BASE) MCG/ACT IN AERS: 1.0000 | INHALATION_SPRAY | Freq: Four times a day (QID) | RESPIRATORY_TRACT | 0 refills | Status: DC | PRN

## 2016-12-22 MED ORDER — ALBUTEROL SULFATE (2.5 MG/3ML) 0.083% IN NEBU
2.5000 mg | INHALATION_SOLUTION | Freq: Once | RESPIRATORY_TRACT | Status: AC
  Administered 2016-12-22: 2.5 mg via RESPIRATORY_TRACT
  Filled 2016-12-22: qty 3

## 2016-12-22 MED ORDER — IPRATROPIUM BROMIDE 0.02 % IN SOLN
0.5000 mg | Freq: Once | RESPIRATORY_TRACT | Status: AC
  Administered 2016-12-22: 0.5 mg via RESPIRATORY_TRACT
  Filled 2016-12-22: qty 2.5

## 2016-12-22 MED ORDER — ACETAMINOPHEN 500 MG PO TABS
1000.0000 mg | ORAL_TABLET | Freq: Once | ORAL | Status: AC
  Administered 2016-12-22: 1000 mg via ORAL
  Filled 2016-12-22: qty 2

## 2016-12-22 MED ORDER — IPRATROPIUM BROMIDE 0.02 % IN SOLN
0.5000 mg | Freq: Once | RESPIRATORY_TRACT | Status: AC
  Administered 2016-12-22: 0.5 mg via RESPIRATORY_TRACT

## 2016-12-22 NOTE — ED Notes (Signed)
Pt ambulated down hallway with pulse Ox. SpO2 remained at 97%. Pt had steady gait and tolerated well.

## 2016-12-22 NOTE — ED Triage Notes (Addendum)
Pt at work and mixed lime away and bleach to clean floor. Poison control contacted and states that if staff can't smell the mixture on pt then just follow resp protocol. Pt cant stop coughing. Pt feels as if his voice is hoarse. PA in to see pt immediately
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
## 2017-05-25 ENCOUNTER — Other Ambulatory Visit: Payer: Self-pay | Admitting: Internal Medicine

## 2017-05-25 ENCOUNTER — Ambulatory Visit
Admission: RE | Admit: 2017-05-25 | Discharge: 2017-05-25 | Disposition: A | Discharge status: Home / Self Care | Payer: No Typology Code available for payment source | Source: Ambulatory Visit | Attending: Internal Medicine | Admitting: Internal Medicine

## 2017-05-25 DIAGNOSIS — A150 Tuberculosis of lung: Secondary | ICD-10-CM

## 2017-08-28 ENCOUNTER — Encounter (HOSPITAL_BASED_OUTPATIENT_CLINIC_OR_DEPARTMENT_OTHER): Payer: Self-pay | Admitting: *Deleted

## 2017-08-28 ENCOUNTER — Other Ambulatory Visit: Payer: Self-pay

## 2017-09-04 ENCOUNTER — Ambulatory Visit (HOSPITAL_BASED_OUTPATIENT_CLINIC_OR_DEPARTMENT_OTHER): Admit: 2017-09-04 | Payer: BLUE CROSS/BLUE SHIELD | Admitting: Specialist

## 2017-09-04 HISTORY — DX: Gastro-esophageal reflux disease without esophagitis: K21.9

## 2017-09-04 HISTORY — DX: Localized swelling, mass and lump, head: R22.0

## 2017-09-04 SURGERY — EXCISION, MASS, HEAD
Anesthesia: General | Laterality: Right

## 2017-11-03 ENCOUNTER — Encounter: Payer: Self-pay | Admitting: Family Medicine

## 2017-11-03 ENCOUNTER — Ambulatory Visit: Payer: BLUE CROSS/BLUE SHIELD | Attending: Family Medicine | Admitting: Family Medicine

## 2017-11-03 ENCOUNTER — Other Ambulatory Visit: Payer: Self-pay

## 2017-11-03 VITALS — BP 132/84 | HR 93 | Temp 98.3°F | Resp 12 | Ht 67.5 in | Wt 237.0 lb

## 2017-11-03 DIAGNOSIS — E669 Obesity, unspecified: Secondary | ICD-10-CM

## 2017-11-03 DIAGNOSIS — M25562 Pain in left knee: Secondary | ICD-10-CM | POA: Diagnosis not present

## 2017-11-03 DIAGNOSIS — R21 Rash and other nonspecific skin eruption: Secondary | ICD-10-CM

## 2017-11-03 DIAGNOSIS — Z23 Encounter for immunization: Secondary | ICD-10-CM

## 2017-11-03 DIAGNOSIS — Z882 Allergy status to sulfonamides status: Secondary | ICD-10-CM | POA: Insufficient documentation

## 2017-11-03 DIAGNOSIS — F17200 Nicotine dependence, unspecified, uncomplicated: Secondary | ICD-10-CM

## 2017-11-03 DIAGNOSIS — K1370 Unspecified lesions of oral mucosa: Secondary | ICD-10-CM

## 2017-11-03 DIAGNOSIS — Z6836 Body mass index (BMI) 36.0-36.9, adult: Secondary | ICD-10-CM | POA: Insufficient documentation

## 2017-11-03 DIAGNOSIS — R030 Elevated blood-pressure reading, without diagnosis of hypertension: Secondary | ICD-10-CM

## 2017-11-03 DIAGNOSIS — G8929 Other chronic pain: Secondary | ICD-10-CM | POA: Diagnosis not present

## 2017-11-03 DIAGNOSIS — R351 Nocturia: Secondary | ICD-10-CM

## 2017-11-03 DIAGNOSIS — Z8249 Family history of ischemic heart disease and other diseases of the circulatory system: Secondary | ICD-10-CM | POA: Insufficient documentation

## 2017-11-03 DIAGNOSIS — F17210 Nicotine dependence, cigarettes, uncomplicated: Secondary | ICD-10-CM | POA: Insufficient documentation

## 2017-11-03 DIAGNOSIS — Z833 Family history of diabetes mellitus: Secondary | ICD-10-CM | POA: Insufficient documentation

## 2017-11-03 MED ORDER — IBUPROFEN 600 MG PO TABS: 600.0000 mg | ORAL_TABLET | Freq: Three times a day (TID) | ORAL | 2 refills | Status: DC | PRN

## 2017-11-03 NOTE — Progress Notes (Signed)
Establish care
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
## 2017-11-04 LAB — COMPREHENSIVE METABOLIC PANEL WITH GFR
ALT: 24 IU/L (ref 0–44)
AST: 28 IU/L (ref 0–40)
Albumin/Globulin Ratio: 1.6 (ref 1.2–2.2)
Albumin: 4.2 g/dL (ref 3.5–5.5)
Alkaline Phosphatase: 49 IU/L (ref 39–117)
BUN/Creatinine Ratio: 14 (ref 9–20)
BUN: 14 mg/dL (ref 6–24)
Bilirubin Total: 0.3 mg/dL (ref 0.0–1.2)
CO2: 22 mmol/L (ref 20–29)
Calcium: 9.3 mg/dL (ref 8.7–10.2)
Chloride: 105 mmol/L (ref 96–106)
Creatinine, Ser: 1.01 mg/dL (ref 0.76–1.27)
GFR calc Af Amer: 99 mL/min/1.73
GFR calc non Af Amer: 86 mL/min/1.73
Globulin, Total: 2.7 g/dL (ref 1.5–4.5)
Glucose: 78 mg/dL (ref 65–99)
Potassium: 4 mmol/L (ref 3.5–5.2)
Sodium: 144 mmol/L (ref 134–144)
Total Protein: 6.9 g/dL (ref 6.0–8.5)

## 2017-11-04 LAB — PSA: Prostate Specific Ag, Serum: 0.9 ng/mL (ref 0.0–4.0)

## 2017-11-04 LAB — HEMOGLOBIN A1C
Est. average glucose Bld gHb Est-mCnc: 114 mg/dL
Hgb A1c MFr Bld: 5.6 % (ref 4.8–5.6)

## 2017-11-06 ENCOUNTER — Telehealth: Payer: Self-pay | Admitting: Family Medicine

## 2017-11-06 NOTE — Telephone Encounter (Signed)
Patient called concerned about the cancer screening. Please follow up with patient.

## 2017-11-10 ENCOUNTER — Ambulatory Visit (INDEPENDENT_AMBULATORY_CARE_PROVIDER_SITE_OTHER): Payer: BLUE CROSS/BLUE SHIELD | Admitting: Orthopaedic Surgery

## 2017-11-17 ENCOUNTER — Ambulatory Visit: Payer: BLUE CROSS/BLUE SHIELD | Attending: Family Medicine | Admitting: Pharmacist

## 2017-11-17 ENCOUNTER — Encounter: Payer: Self-pay | Admitting: Pharmacist

## 2017-11-17 VITALS — BP 138/83 | HR 90

## 2017-11-17 DIAGNOSIS — E669 Obesity, unspecified: Secondary | ICD-10-CM | POA: Diagnosis not present

## 2017-11-17 DIAGNOSIS — R030 Elevated blood-pressure reading, without diagnosis of hypertension: Secondary | ICD-10-CM

## 2017-11-17 NOTE — Patient Instructions (Signed)
Thank you for coming to see [place] today.   Blood pressure today is elevated.  We will forward this information to Dr. Chapman Fitch.   Limiting salt and caffeine, as well as exercising as able for at least 30 minutes for 5 days out of the week, can also help you lower your blood pressure.  Take your blood pressure at home if you are able. Please write down these numbers and bring them to your visits.  If you have any questions about medications, please call me [phone].  Richard Sexton.

## 2017-11-17 NOTE — Progress Notes (Signed)
   S:    PCP: Dr. Chapman Fitch  Patient arrives in good spirits. Presents to the clinic for hypertension evaluation, counseling, and management. Patient was referred by Dr. Chapman Fitch on 11/03/17 for BP re-check and labs. BP at that visit 132/84. No medication initiated at that time.   Denies CP, SOB, HA, or blurred vision. Denies dizziness and LE edema.   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  O:  BP in L arm after 5 minutes rest: 138/83, HR 90 Last 3 Office BP readings: BP Readings from Last 3 Encounters:  11/03/17 132/84  12/22/16 (!) 138/102  12/04/14 140/99    BMET    Component Value Date/Time   NA 144 11/03/2017 0928   K 4.0 11/03/2017 0928   CL 105 11/03/2017 0928   CO2 22 11/03/2017 0928   GLUCOSE 78 11/03/2017 0928   GLUCOSE 94 04/28/2009 2153   BUN 14 11/03/2017 0928   CREATININE 1.01 11/03/2017 0928   CALCIUM 9.3 11/03/2017 0928   GFRNONAA 86 11/03/2017 0928   GFRAA 99 11/03/2017 0928    Renal function: Estimated Creatinine Clearance: 102.1 mL/min (by C-G formula based on SCr of 1.01 mg/dL).  A/P: Hypertension Undiagnosed. BP elevated on 2 separate clinic visits. Patient does not currently take medications for hypertension. Will forward information to PCP for dx. I will see him after dx.  -lipid panel -Counseled on lifestyle modifications for blood pressure control including reduced dietary sodium, increased exercise, adequate sleep  Results reviewed and written information provided. Total time in face-to-face counseling 15 minutes.   F/U Clinic Visit in 2 weeks.    Patient seen  with Dixon Boos, PharmD Candidate [employer] 2021  Benard Halsted, PharmD, [place] [phone]

## 2017-11-18 LAB — LIPID PANEL
Chol/HDL Ratio: 3.3 ratio (ref 0.0–5.0)
Cholesterol, Total: 154 mg/dL (ref 100–199)
HDL: 46 mg/dL
LDL Calculated: 92 mg/dL (ref 0–99)
Triglycerides: 80 mg/dL (ref 0–149)
VLDL Cholesterol Cal: 16 mg/dL (ref 5–40)

## 2017-11-19 ENCOUNTER — Encounter: Payer: Self-pay | Admitting: Family Medicine

## 2017-11-19 DIAGNOSIS — I10 Essential (primary) hypertension: Secondary | ICD-10-CM | POA: Insufficient documentation

## 2017-11-22 ENCOUNTER — Telehealth: Payer: Self-pay | Admitting: *Deleted

## 2017-11-22 NOTE — Telephone Encounter (Signed)
Patient verified DOB Patient is aware of cholesterol levels being pretty good and to continue with the physical activity and healthy diet. No further questions.

## 2017-11-22 NOTE — Telephone Encounter (Signed)
-----   Message from Antony Blackbird, MD sent at 11/19/2017  6:23 PM EDT ----- Notify patient that his lipid panel looks pretty good. Continue a healthy diet and exercise as tolerated. Would like his LDL to remain below 100 with a goal of 70 or less

## 2017-12-01 ENCOUNTER — Encounter: Payer: Self-pay | Admitting: Pharmacist

## 2017-12-01 ENCOUNTER — Ambulatory Visit: Payer: BLUE CROSS/BLUE SHIELD | Attending: Family Medicine | Admitting: Pharmacist

## 2017-12-01 ENCOUNTER — Encounter: Payer: Self-pay | Admitting: Family Medicine

## 2017-12-01 VITALS — BP 137/96 | HR 82

## 2017-12-01 DIAGNOSIS — I10 Essential (primary) hypertension: Secondary | ICD-10-CM

## 2017-12-01 DIAGNOSIS — F17200 Nicotine dependence, unspecified, uncomplicated: Secondary | ICD-10-CM | POA: Insufficient documentation

## 2017-12-01 MED ORDER — AMLODIPINE BESYLATE 5 MG PO TABS: 5.0000 mg | ORAL_TABLET | Freq: Every day | ORAL | 0 refills | Status: DC

## 2017-12-01 NOTE — Patient Instructions (Signed)
Thank you for coming to see [place] today.   Blood pressure today is elevated  Start taking amlodipine 5 mg 1 tablet daily.   Limiting salt and caffeine, as well as exercising as able for at least 30 minutes for 5 days out of the week, can also help you lower your blood pressure.  Take your blood pressure at home if you are able. Please write down these numbers and bring them to your visits.  If you have any questions about medications, please call me [phone].  Lurena Joiner

## 2017-12-01 NOTE — Progress Notes (Signed)
   S:    PCP: Dr. Chapman Fitch  Patient arrives in good spirits. Presents to the clinic for hypertension evaluation, counseling, and management. Patient was referred by Dr. Chapman Fitch on 11/03/17. I last saw him 11/17/17. BP was elevated. Forwarded results to Dr. Chapman Fitch who has dx'd pt with hypertension.   Denies CP, SOB, HA, or blurred vision. Denies dizziness and LE edema.   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  O:  BP in L arm after 5 minutes rest: 137 /96, HR 82 Last 3 Office BP readings: BP Readings from Last 3 Encounters:  11/17/17 138/83  11/03/17 132/84  12/22/16 (!) 138/102    BMET    Component Value Date/Time   NA 144 11/03/2017 0928   K 4.0 11/03/2017 0928   CL 105 11/03/2017 0928   CO2 22 11/03/2017 0928   GLUCOSE 78 11/03/2017 0928   GLUCOSE 94 04/28/2009 2153   BUN 14 11/03/2017 0928   CREATININE 1.01 11/03/2017 0928   CALCIUM 9.3 11/03/2017 0928   GFRNONAA 86 11/03/2017 0928   GFRAA 99 11/03/2017 0928    Renal function: CrCl cannot be calculated (Patient's most recent lab result is older than the maximum 21 days allowed.).  A/P: Hypertension newly diagnosed currently uncontrolled. Patient does not currently take medications for hypertension. Will start amlodipine.  -Start amlodipine 5 mg daily -Counseled on lifestyle modifications for blood pressure control including reduced dietary sodium, increased exercise, adequate sleep  Results reviewed and written information provided. Total time in face-to-face counseling 15 minutes.   F/U Clinic Visit in 4 weeks.    Benard Halsted,  PharmD, [place] [phone]

## 2018-01-05 ENCOUNTER — Ambulatory Visit: Payer: Self-pay | Admitting: Pharmacist

## 2018-01-19 ENCOUNTER — Ambulatory Visit: Payer: Self-pay | Admitting: Pharmacist

## 2018-01-22 ENCOUNTER — Ambulatory Visit: Payer: BLUE CROSS/BLUE SHIELD | Attending: Family Medicine | Admitting: Pharmacist

## 2018-01-22 ENCOUNTER — Encounter: Payer: Self-pay | Admitting: Pharmacist

## 2018-01-22 VITALS — BP 132/82 | HR 71

## 2018-01-22 DIAGNOSIS — Z8249 Family history of ischemic heart disease and other diseases of the circulatory system: Secondary | ICD-10-CM | POA: Insufficient documentation

## 2018-01-22 DIAGNOSIS — Z79899 Other long term (current) drug therapy: Secondary | ICD-10-CM | POA: Insufficient documentation

## 2018-01-22 DIAGNOSIS — Z23 Encounter for immunization: Secondary | ICD-10-CM

## 2018-01-22 DIAGNOSIS — Z833 Family history of diabetes mellitus: Secondary | ICD-10-CM | POA: Insufficient documentation

## 2018-01-22 DIAGNOSIS — I10 Essential (primary) hypertension: Secondary | ICD-10-CM | POA: Insufficient documentation

## 2018-01-22 MED ORDER — PNEUMOCOCCAL VAC POLYVALENT 25 MCG/0.5ML IJ INJ
0.5000 mL | INJECTION | Freq: Once | INTRAMUSCULAR | Status: AC
  Administered 2018-01-22: 0.5 mL via INTRAMUSCULAR

## 2018-01-22 NOTE — Patient Instructions (Signed)
Thank you for coming to see us today.   Blood pressure today is improved.   Continue taking blood pressure medications as prescribed.   Limiting salt and caffeine, as well as exercising as able for at least 30 minutes for 5 days out of the week, can also help you lower your blood pressure.  Take your blood pressure at home if you are able. Please write down these numbers and bring them to your visits.  If you have any questions about medications, please call me [phone].  Luke  

## 2018-01-22 NOTE — Progress Notes (Signed)
   S:    PCP: Dr. Chapman Fitch  Patient arrives in good spirits. Presents to the clinic for hypertension management. Patient was referred by Dr. Chapman Fitch on 11/03/17. I last saw him 12/01/17. We started amlodipine 5 mg daily.   Today, he denies chest pain, shortness of breath, headache, or blurred vision. Denies dizziness and LE edema.   Current BP Medications include:   - Amlodipine 5 mg   Dietary habits include:  - Patient limits salt - Drinks ~1 soda daily Exercise habits include: - Pt walks to work daily (~8 minutes) Family / Social history:  - FH: DM, HTN (mother) - Tobacco: 0.5 - 1 PPD - Alcohol: "on the weekends"  Home BP readings:  - does not take at home   O:  BP in L arm after 5 minutes rest: 132/82, HR 71  Last 3 Office BP readings: BP Readings from Last 3 Encounters:  01/22/18 132/82  12/01/17 (!) 137/96  11/17/17 138/83   BMET    Component Value Date/Time   NA 144 11/03/2017 0928   K 4.0 11/03/2017 0928   CL 105 11/03/2017 0928   CO2 22 11/03/2017 0928   GLUCOSE 78 11/03/2017 0928   GLUCOSE 94 04/28/2009 2153   BUN 14 11/03/2017 0928   CREATININE 1.01 11/03/2017 0928   CALCIUM 9.3 11/03/2017 0928   GFRNONAA 86 11/03/2017 0928   GFRAA 99 11/03/2017 0928    Renal function: CrCl cannot be calculated (Patient's most recent lab result is older than the maximum 21 days allowed.).  Clinical ASCVD: No  The 10-year ASCVD risk score Mikey Bussing DC Jr., et al., 2013) is: 15.4%   Values used to calculate the score:     Age: 22 years     Sex: Male     Is Non-Hispanic African American: Yes     Diabetic: No     Tobacco smoker: Yes     Systolic Blood Pressure: 342 mmHg     Is BP treated: Yes     HDL Cholesterol: 46 mg/dL     Total Cholesterol: 154 mg/dL   A/P: Hypertension longstanding currently uncontrolled but improved. BP goal < 130/80. Patient reports adherence to medications.  - Continue amlodipine 5 mg daily - Counseled on lifestyle modifications for blood  pressure control including reduced dietary sodium, increased exercise, adequate sleep  ASCVD risk: primary prevention. No hx of DM, ACS, stroke, or primary hyperlipidemia. Last LDL < 100, however, pt's ASCVD risk score is 15.4% He may benefit from moderate intensity statin therapy. Will inform PCP.   - HM: Pneumovax indicated with smoking history; given - of note, pt reports interest in colonoscopy. Will message PCP.   Results reviewed and written information provided. Total time in face-to-face counseling 15 minutes.   F/U Clinic Visit in 4 weeks.    Benard Halsted, PharmD, [place] [phone]

## 2018-01-23 ENCOUNTER — Telehealth: Payer: Self-pay | Admitting: Pharmacist

## 2018-01-23 ENCOUNTER — Other Ambulatory Visit: Payer: Self-pay | Admitting: Family Medicine

## 2018-01-23 DIAGNOSIS — R21 Rash and other nonspecific skin eruption: Secondary | ICD-10-CM

## 2018-01-23 DIAGNOSIS — Z79899 Other long term (current) drug therapy: Secondary | ICD-10-CM

## 2018-01-23 DIAGNOSIS — Z1211 Encounter for screening for malignant neoplasm of colon: Secondary | ICD-10-CM

## 2018-01-23 MED ORDER — TERBINAFINE HCL 250 MG PO TABS: 250.0000 mg | ORAL_TABLET | Freq: Every day | ORAL | 0 refills | Status: DC

## 2018-01-23 NOTE — Telephone Encounter (Signed)
PC placed. Left HIPAA-compliant VM informing pt of sent prescription. Left detailed instructions informing patient of the need to call back to make a lab appointment in 4 weeks. Call back number given.

## 2018-01-23 NOTE — Progress Notes (Signed)
Patient ID: Richard Sexton, male   DOB: [birth year], 52 y.o.   MRN: [id number]   Patient was recently seen by the clinical pharmacist in follow-up of his hypertension.  Patient while seeing the clinical pharmacist wanted to know if terbinafine could be prescribed for his possible fungal foot rash.  Patient also wanted to be scheduled for his screening colonoscopy.  Patient was discussed with the pharmacist and patient will be prescribed terbinafine 250 mg daily x4 weeks as patient's recent liver enzymes were normal.  Patient will need to have repeat hepatic panel at the end of therapy.  Patient will also be scheduled for a screening colonoscopy.

## 2018-02-09 ENCOUNTER — Ambulatory Visit: Payer: Self-pay | Admitting: Family Medicine

## 2018-02-19 ENCOUNTER — Encounter (HOSPITAL_COMMUNITY): Payer: Self-pay | Admitting: *Deleted

## 2018-02-19 ENCOUNTER — Emergency Department (HOSPITAL_COMMUNITY): Payer: BLUE CROSS/BLUE SHIELD

## 2018-02-19 ENCOUNTER — Emergency Department (HOSPITAL_COMMUNITY)
Admission: EM | Admit: 2018-02-19 | Discharge: 2018-02-19 | Disposition: A | Discharge status: Home / Self Care | Payer: BLUE CROSS/BLUE SHIELD | Attending: Emergency Medicine | Admitting: Emergency Medicine

## 2018-02-19 DIAGNOSIS — F17210 Nicotine dependence, cigarettes, uncomplicated: Secondary | ICD-10-CM | POA: Insufficient documentation

## 2018-02-19 DIAGNOSIS — M25511 Pain in right shoulder: Secondary | ICD-10-CM | POA: Insufficient documentation

## 2018-02-19 MED ORDER — NAPROXEN 500 MG PO TABS: 500.0000 mg | ORAL_TABLET | Freq: Two times a day (BID) | ORAL | 0 refills | Status: DC

## 2018-02-19 NOTE — ED Provider Notes (Signed)
[facility] Emergency Department Provider Note MRN:  [id number]  Arrival date & time: 02/19/18     Chief Complaint   Shoulder Pain   History of Present Illness   Richard Sexton is a 53 y.o. year-old male with a history of GERD presenting to the ED with chief complaint of shoulder pain.  1 to 2 days of gradual onset progressively worsening right shoulder pain.  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  Noticed some mild pain in the right shoulder 2 days ago, now moderate to severe, trouble moving the shoulder without significant pain.  Denies trauma, no fever, no other symptoms.  Review of Systems  A complete 10 system review of systems was obtained and all systems are negative except as noted in the HPI and PMH.   Patient's Health History    Past Medical History:  Diagnosis Date  . Broken leg 1975   no surgery ,PT was casted - LT leg  . GERD (gastroesophageal reflux disease)    occ  . Mass of head    right forehead    Past Surgical History:  Procedure Laterality Date  . LEG SURGERY Right 1986   Femur shorten    Family History  Problem Relation Age of Onset  . Diabetes Mother   . Hypertension Mother     Social History   Socioeconomic History  . Marital status: Single    Spouse name: Not on file  . Number of children: 0  . Years of education: Not on file  . Highest education level: High school graduate  Occupational History  . Not on file  Social Needs  . Financial resource strain: Not on file  . Food insecurity:    Worry: Never true    Inability: Never true  . Transportation needs:    Medical: No    Non-medical: No  Tobacco Use  . Smoking status: Current Every Day Smoker    Packs/day: 0.50  . Smokeless tobacco: Never Used  Substance and Sexual Activity  . Alcohol use: Yes    Comment: social  . Drug use: No  . Sexual activity: Yes  Lifestyle  . Physical activity:    Days per week: Not on file     Minutes per session: Not on file  . Stress: Not on file  Relationships  . Social connections:    Talks on phone: Not on file    Gets together: Not on file    Attends religious service: Not on file    Active member of club or organization: Not on file    Attends meetings of clubs or organizations: Not on file    Relationship status: Not on file  . Intimate partner violence:    Fear of current or ex partner: Not on file    Emotionally abused: Not on file    Physically abused: Not on file    Forced sexual activity: Not on file  Other Topics Concern  . Not on file  Social History Narrative  . Not on file     Physical Exam  Vital Signs and Nursing Notes reviewed Vitals:   02/19/18 1059  BP: (!) 159/85  Pulse: (!) 58  Resp: 20  Temp: 98.9 F (37.2 C)  SpO2: 100%    CONSTITUTIONAL: Well-appearing, NAD NEURO:  Alert and oriented x 3, no focal deficits EYES:  eyes equal and reactive ENT/NECK:  no LAD, no JVD CARDIO: Regular rate, well-perfused, normal  S1 and S2 PULM:  CTAB no wheezing or rhonchi GI/GU:  normal bowel sounds, non-distended, non-tender MSK/SPINE:  No gross deformities, no edema; tenderness to palpation to the right shoulder with reduced range of motion due to pain, no erythema, no increased warmth SKIN:  no rash, atraumatic PSYCH:  Appropriate speech and behavior  Diagnostic and Interventional Summary    EKG Interpretation  Date/Time:    Ventricular Rate:    PR Interval:    QRS Duration:   QT Interval:    QTC Calculation:   R Axis:     Text Interpretation:        Labs Reviewed - No data to display  DG Shoulder Right  Final Result      Medications - No data to display   Procedures Critical Care  ED Course and Medical Decision Making  I have reviewed the triage vital signs and the nursing notes.  Pertinent labs & imaging results that were available during my care of the patient were reviewed by me and considered in my medical decision  making (see below for details).  Favoring tendinitis of the right shoulder related to overuse, no erythema or increased warmth or fever to suggest septic joint.  Will initiate conservative therapy, NSAIDs, rest, patient should follow-up with PCP.  After the discussed management above, the patient was determined to be safe for discharge.  The patient was in agreement with this plan and all questions regarding their care were answered.  ED return precautions were discussed and the patient will return to the ED with any significant worsening of condition.  Barth Kirks. Sedonia Small, MD Rosalia [email or web address]  Final Clinical Impressions(s) / ED Diagnoses     ICD-10-CM   1. Acute pain of right shoulder M25.511     ED Discharge Orders         Ordered    naproxen (NAPROSYN) 500 MG tablet  2 times daily     02/19/18 1239             Maudie Flakes, MD 02/19/18 1242

## 2018-02-19 NOTE — Discharge Instructions (Addendum)
You were evaluated in the Emergency Department and after careful evaluation, we did not find any emergent condition requiring admission or further testing in the hospital.  Your symptoms today seem to be due to inflammation of the tendons of the shoulder.  Please rest the shoulder and use the anti-inflammatories provided.  Please return to the Emergency Department if you experience any worsening of your condition.  We encourage you to follow up with a primary care provider.  Thank you for allowing [place] to be a part of your care.

## 2018-02-19 NOTE — ED Triage Notes (Signed)
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

## 2018-02-26 ENCOUNTER — Other Ambulatory Visit: Payer: Self-pay

## 2018-02-26 ENCOUNTER — Ambulatory Visit: Payer: Self-pay | Admitting: Family Medicine

## 2018-03-02 ENCOUNTER — Encounter: Payer: Self-pay | Admitting: Family Medicine

## 2018-03-02 ENCOUNTER — Ambulatory Visit: Payer: Self-pay | Admitting: Pharmacist

## 2019-03-29 ENCOUNTER — Other Ambulatory Visit: Payer: Self-pay

## 2019-03-29 ENCOUNTER — Emergency Department (INDEPENDENT_AMBULATORY_CARE_PROVIDER_SITE_OTHER): Payer: Self-pay

## 2019-03-29 ENCOUNTER — Emergency Department
Admission: EM | Admit: 2019-03-29 | Discharge: 2019-03-29 | Disposition: A | Discharge status: Home / Self Care | Payer: BLUE CROSS/BLUE SHIELD | Source: Home / Self Care | Attending: Family Medicine | Admitting: Family Medicine

## 2019-03-29 DIAGNOSIS — I10 Essential (primary) hypertension: Secondary | ICD-10-CM

## 2019-03-29 DIAGNOSIS — R1030 Lower abdominal pain, unspecified: Secondary | ICD-10-CM

## 2019-03-29 LAB — POCT URINALYSIS DIP (MANUAL ENTRY)
Blood, UA: NEGATIVE
Glucose, UA: NEGATIVE mg/dL
Leukocytes, UA: NEGATIVE
Nitrite, UA: NEGATIVE
Protein Ur, POC: 30 mg/dL — AB
Spec Grav, UA: 1.025 (ref 1.010–1.025)
Urobilinogen, UA: 1 E.U./dL
pH, UA: 5.5 (ref 5.0–8.0)

## 2019-03-29 LAB — POCT CBC W AUTO DIFF (K'VILLE URGENT CARE)

## 2019-03-29 NOTE — ED Provider Notes (Signed)
[facility]    CSN: [id number] Arrival date & time: 03/29/19  1320      History   Chief Complaint Chief Complaint  Patient presents with  . Stomach issues    HPI Richard Sexton is a 54 y.o. male.   Patient reports that he ate a large pork steak about 3 weeks ago.  Shortly afterwards he experienced lower abdominal pain, bloating, and mild nausea without vomiting.  Since then he has small irregular bowel movements and daily chills/sweats.  He ate at [employer] about 10 days ago which resulted in similar abdominal discomfort.  His appetite has decreased and he feel consistently bloated in his lower abdomen.  Yesterday he had an episode of loose stools.  He states that he has had a recent episode of black stools but admits that he had taken Pepto Bismol prior.  He denies hematochezia. He denies history of abdominal surgery, and no family history of GI problems.  The history is provided by the patient.  Abdominal Pain Pain location: lower quadrants. Pain quality: bloating and pressure   Pain radiates to:  Does not radiate Pain severity:  Moderate Onset quality:  Sudden Duration:  3 weeks Timing:  Sporadic Progression:  Waxing and waning Chronicity:  New Context: not awakening from sleep, not diet changes, not eating, not laxative use, not previous surgeries, not recent illness, not recent travel, not sick contacts, not suspicious food intake and not trauma   Relieved by:  Nothing Worsened by:  Eating Ineffective treatments: PeptoBismol. Associated symptoms: constipation, diarrhea, fatigue, nausea and vomiting   Associated symptoms: no anorexia, no belching, no chest pain, no chills, no cough, no dysuria, no fever, no flatus, no hematemesis, no hematochezia, no hematuria, no melena, no shortness of breath and no sore throat   Risk factors: obesity   Risk factors: has not had multiple surgeries     Past Medical History:  Diagnosis Date  . Broken leg 1975   no surgery  ,PT was casted - LT leg  . GERD (gastroesophageal reflux disease)    occ  . Mass of head    right forehead    Patient Active Problem List   Diagnosis Date Noted  . Essential hypertension 11/19/2017  . Broken leg   . ONYCHOMYCOSIS, TOENAILS 04/28/2009  . SMOKER 04/28/2009  . COCAINE ABUSE, HX OF 04/28/2009  . EPIDERMOID CYST 02/16/2009  . ELEVATED BLOOD PRESSURE WITHOUT DIAGNOSIS OF HYPERTENSION 02/16/2009    Past Surgical History:  Procedure Laterality Date  . LEG SURGERY Right 1986   Femur shorten       Home Medications    Prior to Admission medications   Medication Sig Start Date End Date Taking? Authorizing Provider  amLODipine (NORVASC) 5 MG tablet Take 1 tablet (5 mg total) by mouth daily. 12/01/17   Fulp, Cammie, MD  ibuprofen (ADVIL,MOTRIN) 600 MG tablet Take 1 tablet (600 mg total) by mouth every 8 (eight) hours as needed. Take after eating 11/03/17   Fulp, Cammie, MD  naproxen (NAPROSYN) 500 MG tablet Take 1 tablet (500 mg total) by mouth 2 (two) times daily. 02/19/18   Maudie Flakes, MD  terbinafine (LAMISIL) 250 MG tablet Take 1 tablet (250 mg total) by mouth daily. For 4 weeks for treatment of fungal rash 01/23/18   Antony Blackbird, MD    Family History Family History  Problem Relation Age of Onset  . Diabetes Mother   . Hypertension Mother     Social History Social History  Tobacco Use  . Smoking status: Current Every Day Smoker    Packs/day: 0.50  . Smokeless tobacco: Never Used  Substance Use Topics  . Alcohol use: Yes    Comment: social  . Drug use: No     Allergies   Bactrim [sulfamethoxazole-trimethoprim]   Review of Systems Review of Systems  Constitutional: Positive for appetite change, diaphoresis and fatigue. Negative for activity change, chills and fever.  HENT: Negative for sore throat.   Respiratory: Negative for cough and shortness of breath.   Cardiovascular: Negative for chest pain.  Gastrointestinal: Positive for abdominal  pain, constipation, diarrhea, nausea and vomiting. Negative for anorexia, flatus, hematemesis, hematochezia and melena.  Genitourinary: Negative for dysuria and hematuria.  All other systems reviewed and are negative.    Physical Exam Triage Vital Signs ED Triage Vitals  Enc Vitals Group     BP 03/29/19 1336 (!) 157/119     Pulse Rate 03/29/19 1336 (!) 107     Resp 03/29/19 1336 18     Temp 03/29/19 1336 99 F (37.2 C)     Temp Source 03/29/19 1336 Oral     SpO2 03/29/19 1336 98 %     Weight 03/29/19 1337 247 lb (112 kg)     Height 03/29/19 1337 5\' 7"  (1.702 m)     Head Circumference --      Peak Flow --      Pain Score 03/29/19 1337 5     Pain Loc --      Pain Edu? --      Excl. in [place]? --    No data found.  Updated Vital Signs BP (!) 137/102 (BP Location: Right Arm)   Pulse (!) 107   Temp 99 F (37.2 C) (Oral)   Resp 18   Ht 5\' 7"  (1.702 m)   Wt 112 kg   SpO2 98%   BMI 38.69 kg/m   Visual Acuity Right Eye Distance:   Left Eye Distance:   Bilateral Distance:    Right Eye Near:   Left Eye Near:    Bilateral Near:     Physical Exam Vitals and nursing note reviewed.  Constitutional:      General: He is not in acute distress.    Appearance: He is obese.  HENT:     Head: Normocephalic.     Nose: Nose normal.     Mouth/Throat:     Pharynx: Oropharynx is clear.  Eyes:     Pupils: Pupils are equal, round, and reactive to light.  Cardiovascular:     Rate and Rhythm: Tachycardia present.     Heart sounds: Normal heart sounds.  Pulmonary:     Breath sounds: Normal breath sounds.  Abdominal:     General: Abdomen is protuberant. Bowel sounds are increased. There is no distension.     Palpations: Abdomen is soft. There is no hepatomegaly or splenomegaly.     Tenderness: There is abdominal tenderness in the left lower quadrant. There is no right CVA tenderness, left CVA tenderness, guarding or rebound. Negative signs include McBurney's sign.     Musculoskeletal:     Cervical back: Neck supple.     Right lower leg: No edema.     Left lower leg: No edema.  Lymphadenopathy:     Cervical: No cervical adenopathy.  Skin:    General: Skin is warm and dry.     Findings: No rash.  Neurological:     Mental Status: He is alert.  UC Treatments / Results  Labs (all labs ordered are listed, but only abnormal results are displayed) Labs Reviewed  POCT URINALYSIS DIP (MANUAL ENTRY) - Abnormal; Notable for the following components:      Result Value   Bilirubin, UA small (*)    Ketones, POC UA trace (5) (*)    Protein Ur, POC =30 (*)    All other components within normal limits  POCT CBC W AUTO DIFF ([facility]):  WBC 4.9; LY 33.9; MO 11.0; GR 55.1; Hgb 15.6; Platelets 153     EKG   Radiology DG Abdomen 1 View  Result Date: 03/29/2019 CLINICAL DATA:  Lower abdominal pain, bloating, chills, constipation EXAM: ABDOMEN - 1 VIEW COMPARISON:  None. FINDINGS: 2 supine frontal views of the abdomen and pelvis demonstrate an unremarkable bowel gas pattern. No masses or abnormal calcifications. Lung bases are clear. Postsurgical changes of the right hip. IMPRESSION: 1. Unremarkable bowel gas pattern. Electronically Signed   By: Randa Ngo M.D.   On: 03/29/2019 14:58    Procedures Procedures (including critical care time)  Medications Ordered in UC Medications - No data to display  Initial Impression / Assessment and Plan / UC Course  I have reviewed the triage vital signs and the nursing notes.  Pertinent labs & imaging results that were available during my care of the patient were reviewed by me and considered in my medical decision making (see chart for details).    Normal CBC and unremarkable KUB reassuring.  Suspect constipation.  Begin Miralax.  Recommend follow-up PCP to schedule screening colonoscopy. Note elevated BP today and proteinuria on urinalysis.  Patient states that he quit amlodipine 5mg  daily that  was prescribed in 2019.  Advised to resume amlodipine.     Final Clinical Impressions(s) / UC Diagnoses   Final diagnoses:  Lower abdominal pain  Essential hypertension     Discharge Instructions     Recommend taking daily Miralax, one capful mixed in 4 to 8 ounces of water until bowel movements are regular. Recommend increasing natural fiber in diet.  May also take a daily fiber product such as Citrucel with plenty of fluid.  Resume amlodipine 5mg , one tablet daily for high blood pressure.  Please monitor your blood pressure several times weekly, at different times of the day, record on a calendar with times of measurement and take this record to your Family Doctor.      ED Prescriptions    None        Kandra Nicolas, MD 03/29/19 1544

## 2019-03-29 NOTE — Discharge Instructions (Addendum)
Recommend taking daily Miralax, one capful mixed in 4 to 8 ounces of water until bowel movements are regular. Recommend increasing natural fiber in diet.  May also take a daily fiber product such as Citrucel with plenty of fluid.  Resume amlodipine 5mg , one tablet daily for high blood pressure.  Please monitor your blood pressure several times weekly, at different times of the day, record on a calendar with times of measurement and take this record to your Family Doctor.

## 2019-03-29 NOTE — ED Triage Notes (Addendum)
Pt c/o inconsistent bowel movements x 3 weeks. Says he ate something that gave him nausea and chills 3 weeks ago. Ate [employer] 10 days ago and hasn't felt right since. Had small bowel movement today and stool appeared very dark. Also c/o pain/pressure feeling in lower abd. Pain 5/10  Pt is also hypertensive today. Says he does not currently take anything for BP

## 2019-03-30 LAB — TSH: TSH: 0.59 mIU/L (ref 0.40–4.50)

## 2019-03-30 LAB — T4, FREE: Free T4: 1.2 ng/dL (ref 0.8–1.8)

## 2019-04-19 ENCOUNTER — Other Ambulatory Visit: Payer: Self-pay

## 2019-04-19 ENCOUNTER — Emergency Department (INDEPENDENT_AMBULATORY_CARE_PROVIDER_SITE_OTHER): Payer: 59

## 2019-04-19 ENCOUNTER — Emergency Department (INDEPENDENT_AMBULATORY_CARE_PROVIDER_SITE_OTHER)
Admission: EM | Admit: 2019-04-19 | Discharge: 2019-04-19 | Disposition: A | Discharge status: Home / Self Care | Payer: 59 | Source: Home / Self Care

## 2019-04-19 DIAGNOSIS — R102 Pelvic and perineal pain: Secondary | ICD-10-CM

## 2019-04-19 DIAGNOSIS — R310 Gross hematuria: Secondary | ICD-10-CM

## 2019-04-19 DIAGNOSIS — R300 Dysuria: Secondary | ICD-10-CM

## 2019-04-19 DIAGNOSIS — R509 Fever, unspecified: Secondary | ICD-10-CM

## 2019-04-19 LAB — COMPLETE METABOLIC PANEL WITH GFR
AG Ratio: 1.3 (calc) (ref 1.0–2.5)
ALT: 25 U/L (ref 9–46)
AST: 29 U/L (ref 10–35)
Albumin: 4.1 g/dL (ref 3.6–5.1)
Alkaline phosphatase (APISO): 46 U/L (ref 35–144)
BUN: 13 mg/dL (ref 7–25)
CO2: 26 mmol/L (ref 20–32)
Calcium: 9.4 mg/dL (ref 8.6–10.3)
Chloride: 101 mmol/L (ref 98–110)
Creat: 1.05 mg/dL (ref 0.70–1.33)
GFR, Est African American: 94 mL/min/{1.73_m2} (ref >=60)
GFR, Est Non African American: 81 mL/min/{1.73_m2} (ref >=60)
Globulin: 3.2 g/dL (calc) (ref 1.9–3.7)
Glucose, Bld: 115 mg/dL — ABNORMAL HIGH (ref 65–99)
Potassium: 3.7 mmol/L (ref 3.5–5.3)
Sodium: 138 mmol/L (ref 135–146)
Total Bilirubin: 0.5 mg/dL (ref 0.2–1.2)
Total Protein: 7.3 g/dL (ref 6.1–8.1)

## 2019-04-19 LAB — POCT CBC W AUTO DIFF (K'VILLE URGENT CARE)

## 2019-04-19 LAB — PSA: PSA: 3.4 ng/mL (ref <=4.0)

## 2019-04-19 MED ORDER — AMOXICILLIN-POT CLAVULANATE 875-125 MG PO TABS: 1.0000 | ORAL_TABLET | Freq: Two times a day (BID) | ORAL | 0 refills | Status: DC

## 2019-04-19 MED ORDER — PHENAZOPYRIDINE HCL 200 MG PO TABS: 200.0000 mg | ORAL_TABLET | Freq: Three times a day (TID) | ORAL | 0 refills | Status: DC

## 2019-04-19 MED ORDER — AMLODIPINE BESYLATE 5 MG PO TABS: 5.0000 mg | ORAL_TABLET | Freq: Every day | ORAL | 0 refills | Status: DC

## 2019-04-19 MED ORDER — ACETAMINOPHEN 325 MG PO TABS
650.0000 mg | ORAL_TABLET | Freq: Four times a day (QID) | ORAL | Status: DC | PRN
  Administered 2019-04-19: 650 mg via ORAL

## 2019-04-19 NOTE — ED Provider Notes (Signed)
[facility]    CSN: [id number] Arrival date & time: 04/19/19  1145      History   Chief Complaint Chief Complaint  Patient presents with  . Dysuria    HPI Richard Sexton is a 54 y.o. male.   HPI  Richard Sexton is a 54 y.o. male presenting to UC with c/o lower abdominal pain with difficulty and burning while urinating since last night.  This morning he states he felt like his urine was blocked but then felt like something "broke lose" and he was able to urinate without difficulty. He has had a lot of blood in his urine this morning. Pain is mild at this time but still pressure when he urinates.  Also reports subjective fever this morning.  Denies n/v/d. No back pain. No prior hx of kidney stones or prostate issues.  No medication taken PTA.  Pt also requesting refill on his BP medication. He plans to establish care at [facility].   Past Medical History:  Diagnosis Date  . Broken leg 1975   no surgery ,PT was casted - LT leg  . GERD (gastroesophageal reflux disease)    occ  . Mass of head    right forehead    Patient Active Problem List   Diagnosis Date Noted  . Essential hypertension 11/19/2017  . Broken leg   . ONYCHOMYCOSIS, TOENAILS 04/28/2009  . SMOKER 04/28/2009  . COCAINE ABUSE, HX OF 04/28/2009  . EPIDERMOID CYST 02/16/2009  . ELEVATED BLOOD PRESSURE WITHOUT DIAGNOSIS OF HYPERTENSION 02/16/2009    Past Surgical History:  Procedure Laterality Date  . LEG SURGERY Right 1986   Femur shorten       Home Medications    Prior to Admission medications   Medication Sig Start Date End Date Taking? Authorizing Provider  amLODipine (NORVASC) 5 MG tablet Take 1 tablet (5 mg total) by mouth daily. 04/19/19   Noe Gens, PA-C  amoxicillin-clavulanate (AUGMENTIN) 875-125 MG tablet Take 1 tablet by mouth 2 (two) times daily. One po bid x 7 days 04/19/19   Noe Gens, PA-C  ibuprofen (ADVIL,MOTRIN) 600 MG tablet Take 1 tablet  (600 mg total) by mouth every 8 (eight) hours as needed. Take after eating 11/03/17   Fulp, Cammie, MD  naproxen (NAPROSYN) 500 MG tablet Take 1 tablet (500 mg total) by mouth 2 (two) times daily. 02/19/18   Maudie Flakes, MD  phenazopyridine (PYRIDIUM) 200 MG tablet Take 1 tablet (200 mg total) by mouth 3 (three) times daily. 04/19/19   Noe Gens, PA-C  terbinafine (LAMISIL) 250 MG tablet Take 1 tablet (250 mg total) by mouth daily. For 4 weeks for treatment of fungal rash 01/23/18   Antony Blackbird, MD    Family History Family History  Problem Relation Age of Onset  . Diabetes Mother   . Hypertension Mother     Social History Social History   Tobacco Use  . Smoking status: Current Every Day Smoker    Packs/day: 0.50  . Smokeless tobacco: Never Used  Substance Use Topics  . Alcohol use: Yes    Comment: social  . Drug use: No     Allergies   Bactrim [sulfamethoxazole-trimethoprim]   Review of Systems Review of Systems  Constitutional: Positive for fever. Negative for chills.  HENT: Negative for congestion, ear pain, sore throat, trouble swallowing and voice change.   Respiratory: Negative for cough and shortness of breath.   Cardiovascular: Negative for chest pain and  palpitations.  Gastrointestinal: Positive for abdominal pain. Negative for diarrhea, nausea and vomiting.  Genitourinary: Positive for dysuria, frequency, hematuria and urgency. Negative for decreased urine volume, discharge, flank pain, penile pain, scrotal swelling and testicular pain.  Musculoskeletal: Negative for arthralgias, back pain and myalgias.  Skin: Negative for rash.     Physical Exam Triage Vital Signs ED Triage Vitals  Enc Vitals Group     BP 04/19/19 1200 (!) 150/99     Pulse Rate 04/19/19 1200 (!) 123     Resp 04/19/19 1200 18     Temp 04/19/19 1200 (!) 102.1 F (38.9 C)     Temp Source 04/19/19 1200 Oral     SpO2 04/19/19 1200 97 %     Weight 04/19/19 1158 245 lb (111.1 kg)      Height 04/19/19 1158 5\' 8"  (1.727 m)     Head Circumference --      Peak Flow --      Pain Score 04/19/19 1158 10     Pain Loc --      Pain Edu? --      Excl. in [place]? --    No data found.  Updated Vital Signs BP (!) 139/92 (BP Location: Right Arm)   Pulse (!) 120   Temp 99.8 F (37.7 C) (Oral)   Resp 18   Ht 5\' 8"  (1.727 m)   Wt 245 lb (111.1 kg)   SpO2 96%   BMI 37.25 kg/m   Visual Acuity Right Eye Distance:   Left Eye Distance:   Bilateral Distance:    Right Eye Near:   Left Eye Near:    Bilateral Near:     Physical Exam Vitals and nursing note reviewed.  Constitutional:      General: He is not in acute distress.    Appearance: Normal appearance. He is well-developed. He is not ill-appearing, toxic-appearing or diaphoretic.     Comments: Pt sitting on exam bed, appears well, NAD. Non-toxic appear.   HENT:     Head: Normocephalic and atraumatic.     Nose: Nose normal.     Mouth/Throat:     Mouth: Mucous membranes are moist.  Cardiovascular:     Rate and Rhythm: Regular rhythm. Tachycardia present.  Pulmonary:     Effort: Pulmonary effort is normal. No respiratory distress.     Breath sounds: Normal breath sounds. No stridor. No wheezing or rhonchi.  Abdominal:     General: There is no distension.     Palpations: Abdomen is soft.     Tenderness: There is abdominal tenderness ( mild, suprapubic). There is no right CVA tenderness or left CVA tenderness.  Musculoskeletal:        General: Normal range of motion.     Cervical back: Normal range of motion.  Skin:    General: Skin is warm and dry.  Neurological:     Mental Status: He is alert and oriented to person, place, and time.  Psychiatric:        Behavior: Behavior normal.      UC Treatments / Results  Labs (all labs ordered are listed, but only abnormal results are displayed) Labs Reviewed  URINE CULTURE  COMPLETE METABOLIC PANEL WITH GFR  PSA  POCT URINALYSIS DIP (MANUAL ENTRY)  POCT CBC W AUTO  DIFF ([facility])    EKG   Radiology CT Renal Stone Study  Result Date: 04/19/2019 CLINICAL DATA:  Gross hematuria. Lower abdominal pain. Fever. Dysuria. EXAM: CT ABDOMEN  AND PELVIS WITHOUT CONTRAST TECHNIQUE: Multidetector CT imaging of the abdomen and pelvis was performed following the standard protocol without IV contrast. COMPARISON:  None. FINDINGS: Lower chest: Heart is at the upper limits of normal in size. Lung bases are clear. Hepatobiliary: No focal liver abnormality is seen. No gallstones, gallbladder wall thickening, or biliary dilatation. Pancreas: Unremarkable. No pancreatic ductal dilatation or surrounding inflammatory changes. Spleen: Normal in size without focal abnormality. Adrenals/Urinary Tract: Normal adrenal glands and kidneys. Bladder wall appears diffusely slightly thickened but the bladder is almost empty. There is a single calcification in the mid prostate gland which could possibly be in the prosthetic urethra. Stomach/Bowel: Stomach is within normal limits. Appendix appears normal. No evidence of bowel wall thickening, distention, or inflammatory changes. Vascular/Lymphatic: Aortic atherosclerosis. No enlarged abdominal or pelvic lymph nodes. Reproductive: Prostate gland is not enlarged. Single calcification in the central portion of prostate gland. Other: No abdominal wall hernia or abnormality. No abdominopelvic ascites. Musculoskeletal: No acute abnormality. Multilevel degenerative facet arthritis in the lumbar spine. IMPRESSION: 1. No definitive acute abnormalities of the abdomen or pelvis. 2. Single calcification in the central portion of the prostate gland which could possibly be in the prosthetic urethra. Aortic Atherosclerosis (ICD10-I70.0). Electronically Signed   By: Lorriane Shire M.D.   On: 04/19/2019 12:55    Procedures Procedures (including critical care time)  Medications Ordered in UC Medications  acetaminophen (TYLENOL) tablet 650 mg (650 mg Oral  Given 04/19/19 1203)    Initial Impression / Assessment and Plan / UC Course  I have reviewed the triage vital signs and the nursing notes.  Pertinent labs & imaging results that were available during my care of the patient were reviewed by me and considered in my medical decision making (see chart for details).     Suspect renal stone given difficulty urinating and hematuria.  Urine sample dark red in color, unable to perform UA, urine culture sent Reviewed stone study with pt.  Given description of something "breaking lose" this morning, pt likely passed a kidney stone at that time. No stones seen on CT Due to fever and continued dysuria, will start pt on Augmentin while urine culture pending. AVS provided  1 month refill on BP meds provided, reminded pt of importance of PCP f/u   Final Clinical Impressions(s) / UC Diagnoses   Final diagnoses:  Dysuria  Gross hematuria  Acute suprapubic pain  Fever and chills     Discharge Instructions      You will be notified of the results of your labs within 1 week, sooner for abnormal labs. You may also see your labs in your MyChart account with [facility].    Please take your antibiotic as prescribed. A urine culture has been sent to check the severity of your urinary infection and to determine if you are on the most appropriate antibiotic. The results should come back within 2-3 days and you will be notified even if no medication change is needed.  Please stay well hydrated and follow up with your family doctor in 1 week if not improving, sooner if worsening.     ED Prescriptions    Medication Sig Dispense Auth. Provider   amoxicillin-clavulanate (AUGMENTIN) 875-125 MG tablet  (Status: Discontinued) Take 1 tablet by mouth 2 (two) times daily. One po bid x 7 days 14 tablet Varnell Orvis O, PA-C   phenazopyridine (PYRIDIUM) 200 MG tablet  (Status: Discontinued) Take 1 tablet (200 mg total) by mouth 3 (three) times daily. 6  tablet Gerarda Fraction,  Meya Clutter O, PA-C   amLODipine (NORVASC) 5 MG tablet  (Status: Discontinued) Take 1 tablet (5 mg total) by mouth daily. 30 tablet Gerarda Fraction, Donella Pascarella O, PA-C   amLODipine (NORVASC) 5 MG tablet Take 1 tablet (5 mg total) by mouth daily. 30 tablet Noe Gens, PA-C   amoxicillin-clavulanate (AUGMENTIN) 875-125 MG tablet Take 1 tablet by mouth 2 (two) times daily. One po bid x 7 days 14 tablet Gerarda Fraction, Evonte Prestage O, PA-C   phenazopyridine (PYRIDIUM) 200 MG tablet Take 1 tablet (200 mg total) by mouth 3 (three) times daily. 6 tablet Noe Gens, PA-C     PDMP not reviewed this encounter.   Noe Gens, [place] 04/19/19 1346

## 2019-04-19 NOTE — Discharge Instructions (Signed)
  You will be notified of the results of your labs within 1 week, sooner for abnormal labs. You may also see your labs in your MyChart account with [facility].    Please take your antibiotic as prescribed. A urine culture has been sent to check the severity of your urinary infection and to determine if you are on the most appropriate antibiotic. The results should come back within 2-3 days and you will be notified even if no medication change is needed.  Please stay well hydrated and follow up with your family doctor in 1 week if not improving, sooner if worsening.

## 2019-04-19 NOTE — ED Notes (Signed)
CMP modified to STAT order. [facility] aware of need for STAT pickup. Confirmation number [id number].

## 2019-04-19 NOTE — ED Triage Notes (Signed)
Patient presents to Urgent Care with complaints of lower abdominal pain and difficulty and pain with urinating since earlier this morning/ middle of the ngiht. Patient reports he has had two episodes of bloody urine, both have been painful.  Patient febrile during triage, temp 102.1. Patient would also like his blood pressure medication refilled to get him through until he can make a PCP appointment, plans to go next door after this visit and schedule an appt.

## 2019-04-21 LAB — URINE CULTURE
MICRO NUMBER:: 10219703
SPECIMEN QUALITY:: ADEQUATE

## 2019-04-24 ENCOUNTER — Encounter: Payer: Self-pay | Admitting: Medical-Surgical

## 2019-04-24 ENCOUNTER — Encounter: Payer: Self-pay | Admitting: Internal Medicine

## 2019-04-24 ENCOUNTER — Ambulatory Visit (INDEPENDENT_AMBULATORY_CARE_PROVIDER_SITE_OTHER): Payer: 59 | Admitting: Medical-Surgical

## 2019-04-24 ENCOUNTER — Other Ambulatory Visit: Payer: Self-pay

## 2019-04-24 VITALS — BP 116/67 | HR 69 | Temp 98.8°F | Ht 65.25 in | Wt 243.7 lb

## 2019-04-24 DIAGNOSIS — Z1211 Encounter for screening for malignant neoplasm of colon: Secondary | ICD-10-CM | POA: Diagnosis not present

## 2019-04-24 DIAGNOSIS — I10 Essential (primary) hypertension: Secondary | ICD-10-CM

## 2019-04-24 DIAGNOSIS — R319 Hematuria, unspecified: Secondary | ICD-10-CM | POA: Diagnosis not present

## 2019-04-24 MED ORDER — AMLODIPINE BESYLATE 5 MG PO TABS: 5.0000 mg | ORAL_TABLET | Freq: Every day | ORAL | 1 refills | Status: DC

## 2019-04-24 NOTE — Progress Notes (Signed)
New Patient Office Visit  Subjective:  Patient ID: Richard Sexton, male    DOB: 1965-02-18  Age: 54 y.o. MRN: [id number]  CC:  Chief Complaint  Patient presents with  . Establish Care  . Hypertension  . Urinary Tract Infection    HPI Richard Sexton presents to establish care.  Was seen on 04/19/2019 for dysuria at urgent care.  At that time had gross hematuria with fever.  Urine culture showed E. coli.  Was treated with Augmentin twice daily.  Per note, suspicion of kidney stone that was passed just prior to urgent care evaluation.  Hypertension-taking amlodipine 5 mg daily.  30-day supply given by urgent care to allow time for establishing with a new PCP.  Denies chest pain, shortness of breath, lower extremity swelling, palpitations, headache, and dizziness/lightheadedness.  Past Medical History:  Diagnosis Date  . Broken leg 1975   no surgery ,PT was casted - LT leg  . GERD (gastroesophageal reflux disease)    occ  . Hypertension   . Mass of head    right forehead    Past Surgical History:  Procedure Laterality Date  . LEG SURGERY Right 1986   Femur shorten    Family History  Problem Relation Age of Onset  . Diabetes Mother   . Hypertension Mother     Social History   Socioeconomic History  . Marital status: Single    Spouse name: Not on file  . Number of children: 0  . Years of education: Not on file  . Highest education level: High school graduate  Occupational History  . Occupation: cook  Tobacco Use  . Smoking status: Current Every Day Smoker    Packs/day: 1.00    Years: 8.00    Pack years: 8.00    Types: Cigarettes  . Smokeless tobacco: Never Used  Substance and Sexual Activity  . Alcohol use: Yes    Comment: 1-2 drinks/week, beer or liquor  . Drug use: No  . Sexual activity: Yes    Partners: Female  Other Topics Concern  . Not on file  Social History Narrative  . Not on file   Social Determinants of Health   Financial Resource Strain:    . Difficulty of Paying Living Expenses: Not on file  Food Insecurity:   . Worried About Charity fundraiser in the Last Year: Not on file  . Ran Out of Food in the Last Year: Not on file  Transportation Needs:   . Lack of Transportation (Medical): Not on file  . Lack of Transportation (Non-Medical): Not on file  Physical Activity:   . Days of Exercise per Week: Not on file  . Minutes of Exercise per Session: Not on file  Stress:   . Feeling of Stress : Not on file  Social Connections:   . Frequency of Communication with Friends and Family: Not on file  . Frequency of Social Gatherings with Friends and Family: Not on file  . Attends Religious Services: Not on file  . Active Member of Clubs or Organizations: Not on file  . Attends Archivist Meetings: Not on file  . Marital Status: Not on file  Intimate Partner Violence:   . Fear of Current or Ex-Partner: Not on file  . Emotionally Abused: Not on file  . Physically Abused: Not on file  . Sexually Abused: Not on file    ROS Review of Systems  Constitutional: Negative for chills and fever.  HENT: Negative for congestion,  sinus pressure and sore throat.   Respiratory: Negative for chest tightness and shortness of breath.   Cardiovascular: Negative for chest pain, palpitations and leg swelling.  Gastrointestinal: Negative for abdominal pain, diarrhea, nausea and vomiting.  Neurological: Negative for dizziness, light-headedness and headaches.  Psychiatric/Behavioral: Negative for self-injury and suicidal ideas.    Objective:   Today's Vitals: BP 116/67   Pulse 69   Temp 98.8 F (37.1 C) (Oral)   Ht 5' 5.25" (1.657 m)   Wt 243 lb 11.2 oz (110.5 kg)   SpO2 97%   BMI 40.24 kg/m   Physical Exam Vitals reviewed.  Constitutional:      General: He is not in acute distress.    Appearance: Normal appearance.  HENT:     Head: Normocephalic and atraumatic.  Cardiovascular:     Rate and Rhythm: Normal rate and  regular rhythm.     Pulses: Normal pulses.     Heart sounds: No murmur. No friction rub. No gallop.   Pulmonary:     Effort: Pulmonary effort is normal. No respiratory distress.     Breath sounds: Normal breath sounds. No wheezing.  Skin:    General: Skin is warm and dry.  Neurological:     Mental Status: He is alert and oriented to person, place, and time.  Psychiatric:        Mood and Affect: Mood normal.        Behavior: Behavior normal.        Thought Content: Thought content normal.        Judgment: Judgment normal.     Assessment & Plan:   1. Essential hypertension Continue Amlodipine 5mg  daily. Refills provided - amLODipine (NORVASC) 5 MG tablet; Take 1 tablet (5 mg total) by mouth daily.  Dispense: 90 tablet; Refill: 1  2. Hematuria, unspecified type Unable to obtain urine in office for repeat UA. Continue Augmentin as prescribed. Continues to have some urinary hesitancy but no weakened stream. Discussed adding Flomax daily to help with urination but patient wants to think about it and will contact us if he decides to try it.  3. Colon cancer screening Due for colonoscopy. Referral to GI ordered. - Ambulatory referral to Gastroenterology  Outpatient Encounter Medications as of 04/24/2019  Medication Sig  . amLODipine (NORVASC) 5 MG tablet Take 1 tablet (5 mg total) by mouth daily.  . [DISCONTINUED] amLODipine (NORVASC) 5 MG tablet Take 1 tablet (5 mg total) by mouth daily.  Marland Kitchen amoxicillin-clavulanate (AUGMENTIN) 875-125 MG tablet Take 1 tablet by mouth 2 (two) times daily. One po bid x 7 days (Patient not taking: Reported on 04/24/2019)  . ibuprofen (ADVIL,MOTRIN) 600 MG tablet Take 1 tablet (600 mg total) by mouth every 8 (eight) hours as needed. Take after eating (Patient not taking: Reported on 04/24/2019)  . naproxen (NAPROSYN) 500 MG tablet Take 1 tablet (500 mg total) by mouth 2 (two) times daily. (Patient not taking: Reported on 04/24/2019)  . phenazopyridine  (PYRIDIUM) 200 MG tablet Take 1 tablet (200 mg total) by mouth 3 (three) times daily. (Patient not taking: Reported on 04/24/2019)  . terbinafine (LAMISIL) 250 MG tablet Take 1 tablet (250 mg total) by mouth daily. For 4 weeks for treatment of fungal rash (Patient not taking: Reported on 04/24/2019)   No facility-administered encounter medications on file as of 04/24/2019.    Follow-up: Return in about 6 months (around 10/25/2019) for HTN.   Clearnce Sorrel, DNP, APRN, FNP-BC [facility]

## 2019-06-15 DIAGNOSIS — Z860101 Personal history of adenomatous and serrated colon polyps: Secondary | ICD-10-CM

## 2019-06-15 DIAGNOSIS — Z86010 Personal history of colonic polyps: Secondary | ICD-10-CM

## 2019-06-15 HISTORY — DX: Personal history of adenomatous and serrated colon polyps: Z86.0101

## 2019-06-15 HISTORY — DX: Personal history of colonic polyps: Z86.010

## 2019-06-15 HISTORY — PX: COLONOSCOPY W/ POLYPECTOMY: SHX1380

## 2019-06-21 ENCOUNTER — Other Ambulatory Visit: Payer: Self-pay

## 2019-06-21 ENCOUNTER — Telehealth: Payer: Self-pay | Admitting: Internal Medicine

## 2019-06-21 ENCOUNTER — Ambulatory Visit (AMBULATORY_SURGERY_CENTER): Payer: Self-pay | Admitting: *Deleted

## 2019-06-21 VITALS — Temp 97.7°F | Ht 68.0 in | Wt 244.0 lb

## 2019-06-21 DIAGNOSIS — Z1211 Encounter for screening for malignant neoplasm of colon: Secondary | ICD-10-CM

## 2019-06-21 MED ORDER — SUPREP BOWEL PREP KIT 17.5-3.13-1.6 GM/177ML PO SOLN: 1.0000 | Freq: Once | ORAL | 0 refills | Status: AC

## 2019-06-21 NOTE — Progress Notes (Signed)
No egg or soy allergy known to patient  No issues with past sedation with any surgeries  or procedures, no intubation problems  No diet pills per patient No home 02 use per patient  No blood thinners per patient  Pt denies issues with constipation  No A fib or A flutter  EMMI video sent to pt's e mail   J [employer] completed 05-22-2019  Wife in [place] temp 97.1- pt and wife aware of COVID precautions   Due to the COVID-19 pandemic we are asking patients to follow these guidelines. Please only bring one care partner. Please be aware that your care partner may wait in the car in the parking lot or if they feel like they will be too hot to wait in the car, they may wait in the lobby on the 4th floor. All care partners are required to wear a mask the entire time (we do not have any that we can provide them), they need to practice social distancing, and we will do a Covid check for all patient's and care partners when you arrive. Also we will check their temperature and your temperature. If the care partner waits in their car they need to stay in the parking lot the entire time and we will call them on their cell phone when the patient is ready for discharge so they can bring the car to the front of the building. Also all patient's will need to wear a mask into building.

## 2019-06-21 NOTE — Telephone Encounter (Signed)
Pt's friend Cecille Rubin called to inform that copay for suprep is over $100. Pt wants to know if he can get anything more affordable. Pls call Cecille Rubin at [phone].

## 2019-06-21 NOTE — Telephone Encounter (Signed)
$  116 dollars per Pharmacy- Pharmacy states they do no thave the [facility] as Pt's insurance-I called Cecille Rubin and she told me they have not taken the [place] card to the Pharmacy- I explained to her with this [place] should be covered at 100%- if she will, take the card to the Pharmacy ASAP and if its still not covered, she is to call and let me know - she states she will take  the card Monday

## 2019-07-05 ENCOUNTER — Other Ambulatory Visit: Payer: Self-pay

## 2019-07-05 ENCOUNTER — Encounter: Payer: Self-pay | Admitting: Internal Medicine

## 2019-07-05 ENCOUNTER — Ambulatory Visit (AMBULATORY_SURGERY_CENTER): Payer: 59 | Admitting: Internal Medicine

## 2019-07-05 VITALS — BP 143/76 | HR 69 | Temp 97.7°F | Resp 14 | Ht 65.0 in | Wt 244.0 lb

## 2019-07-05 DIAGNOSIS — D120 Benign neoplasm of cecum: Secondary | ICD-10-CM | POA: Diagnosis not present

## 2019-07-05 DIAGNOSIS — Z1211 Encounter for screening for malignant neoplasm of colon: Secondary | ICD-10-CM | POA: Diagnosis present

## 2019-07-05 MED ORDER — SODIUM CHLORIDE 0.9 % IV SOLN: 500.0000 mL | Freq: Once | INTRAVENOUS | Status: DC

## 2019-07-05 NOTE — Progress Notes (Signed)
Called to room to assist during endoscopic procedure.  Patient ID and intended procedure confirmed with present staff. Received instructions for my participation in the procedure from the performing physician.  

## 2019-07-05 NOTE — Progress Notes (Signed)
Pt Drowsy. VSS. To PACU, report to RN. No anesthetic complications noted.  

## 2019-07-05 NOTE — Patient Instructions (Addendum)
I found and removed one tiny polyp.  You also have a condition called diverticulosis - common and not usually a problem. Please read the handout provided.  I will let you know pathology results and when to have another routine colonoscopy by mail and/or My Chart.  I appreciate the opportunity to care for you. Gatha Mayer, MD, FACG  YOU HAD AN ENDOSCOPIC PROCEDURE TODAY AT [facility]:   Refer to the procedure report that was given to you for any specific questions about what was found during the examination.  If the procedure report does not answer your questions, please call your gastroenterologist to clarify.  If you requested that your care partner not be given the details of your procedure findings, then the procedure report has been included in a sealed envelope for you to review at your convenience later.  YOU SHOULD EXPECT: Some feelings of bloating in the abdomen. Passage of more gas than usual.  Walking can help get rid of the air that was put into your GI tract during the procedure and reduce the bloating. If you had a lower endoscopy (such as a colonoscopy or flexible sigmoidoscopy) you may notice spotting of blood in your stool or on the toilet paper. If you underwent a bowel prep for your procedure, you may not have a normal bowel movement for a few days.  Please Note:  You might notice some irritation and congestion in your nose or some drainage.  This is from the oxygen used during your procedure.  There is no need for concern and it should clear up in a day or so.  SYMPTOMS TO REPORT IMMEDIATELY:   Following lower endoscopy (colonoscopy or flexible sigmoidoscopy):  Excessive amounts of blood in the stool  Significant tenderness or worsening of abdominal pains  Swelling of the abdomen that is new, acute  Fever of 100F or higher  For urgent or emergent issues, a gastroenterologist can be reached at any hour by calling [phone]. Do not use MyChart  messaging for urgent concerns.    DIET:  We do recommend a small meal at first, but then you may proceed to your regular diet.  Drink plenty of fluids but you should avoid alcoholic beverages for 24 hours.  ACTIVITY:  You should plan to take it easy for the rest of today and you should NOT DRIVE or use heavy machinery until tomorrow (because of the sedation medicines used during the test).    FOLLOW UP: Our staff will call the number listed on your records 48-72 hours following your procedure to check on you and address any questions or concerns that you may have regarding the information given to you following your procedure. If we do not reach you, we will leave a message.  We will attempt to reach you two times.  During this call, we will ask if you have developed any symptoms of COVID 19. If you develop any symptoms (ie: fever, flu-like symptoms, shortness of breath, cough etc.) before then, please call [phone].  If you test positive for Covid 19 in the 2 weeks post procedure, please call and report this information to [place].    If any biopsies were taken you will be contacted by phone or by letter within the next 1-3 weeks.  Please call us at [phone] if you have not heard about the biopsies in 3 weeks.    SIGNATURES/CONFIDENTIALITY: You and/or your care partner have signed paperwork which will be entered into  your electronic medical record.  These signatures attest to the fact that that the information above on your After Visit Summary has been reviewed and is understood.  Full responsibility of the confidentiality of this discharge information lies with you and/or your care-partner.

## 2019-07-05 NOTE — Progress Notes (Signed)
Pt's states no medical or surgical changes since previsit or office visit. 

## 2019-07-05 NOTE — Op Note (Signed)
[place] Patient Name: Lawyer Buster Procedure Date: 07/05/2019 8:20 AM MRN: [id number] Endoscopist: Gatha Mayer , MD Age: 54 Referring MD:  Date of Birth: 01-Jun-1965 Gender: Male Account #: [id number] Procedure:                Colonoscopy Indications:              Screening for colorectal malignant neoplasm, This                            is the patient's first colonoscopy Medicines:                Propofol per Anesthesia, Monitored Anesthesia Care Procedure:                Pre-Anesthesia Assessment:                           - Prior to the procedure, a History and Physical                            was performed, and patient medications and                            allergies were reviewed. The patient's tolerance of                            previous anesthesia was also reviewed. The risks                            and benefits of the procedure and the sedation                            options and risks were discussed with the patient.                            All questions were answered, and informed consent                            was obtained. Prior Anticoagulants: The patient has                            taken no previous anticoagulant or antiplatelet                            agents. ASA Grade Assessment: III - A patient with                            severe systemic disease. After reviewing the risks                            and benefits, the patient was deemed in                            satisfactory condition to undergo the procedure.  After obtaining informed consent, the colonoscope                            was passed under direct vision. Throughout the                            procedure, the patient's blood pressure, pulse, and                            oxygen saturations were monitored continuously. The                            Colonoscope was introduced through the anus and   advanced to the the cecum, identified by                            appendiceal orifice and ileocecal valve. The                            colonoscopy was somewhat difficult due to                            significant looping. Successful completion of the                            procedure was aided by applying abdominal pressure.                            The patient tolerated the procedure well. The                            quality of the bowel preparation was good. The                            bowel preparation used was Miralax via split dose                            instruction. The ileocecal valve, appendiceal                            orifice, and rectum were photographed. Scope In: 8:36:41 AM Scope Out: 8:51:17 AM Scope Withdrawal Time: 0 hours 12 minutes 5 seconds  Total Procedure Duration: 0 hours 14 minutes 36 seconds  Findings:                 The perianal and digital rectal examinations were                            normal. Pertinent negatives include normal prostate                            (size, shape, and consistency).                           A diminutive polyp was found in the  cecum. The                            polyp was sessile. The polyp was removed with a                            cold snare. Resection and retrieval were complete.                            Verification of patient identification for the                            specimen was done. Estimated blood loss was minimal.                           Multiple small-mouthed diverticula were found in                            the left colon.                           The exam was otherwise without abnormality on                            direct and retroflexion views. Complications:            No immediate complications. Estimated Blood Loss:     Estimated blood loss was minimal. Impression:               - One diminutive polyp in the cecum, removed with a                            cold  snare. Resected and retrieved.                           - Diverticulosis in the left colon.                           - The examination was otherwise normal on direct                            and retroflexion views. Recommendation:           - Patient has a contact number available for                            emergencies. The signs and symptoms of potential                            delayed complications were discussed with the                            patient. Return to normal activities tomorrow.                            Written discharge instructions were provided to the  patient.                           - Resume previous diet.                           - Continue present medications.                           - Repeat colonoscopy is recommended. The                            colonoscopy date will be determined after pathology                            results from today's exam become available for                            review. Gatha Mayer, MD 07/05/2019 8:57:18 AM This report has been signed electronically.

## 2019-07-09 ENCOUNTER — Telehealth: Payer: Self-pay

## 2019-07-09 NOTE — Telephone Encounter (Signed)
  Follow up Call-  Call back number 07/05/2019  Post procedure Call Back phone  # [phone]  Permission to leave phone message Yes  Some recent data might be hidden     Left message

## 2019-07-09 NOTE — Telephone Encounter (Signed)
  Follow up Call-  Call back number 07/05/2019  Post procedure Call Back phone  # [phone]  Permission to leave phone message Yes  Some recent data might be hidden     Patient questions:  Do you have a fever, pain , or abdominal swelling? No. Pain Score  0 *  Have you tolerated food without any problems? Yes.    Have you been able to return to your normal activities? Yes.    Do you have any questions about your discharge instructions: Diet   No. Medications  No. Follow up visit  No.  Do you have questions or concerns about your Care? No.  Actions: * If pain score is 4 or above: No action needed, pain <4.  1. Have you developed a fever since your procedure? no  2.   Have you had an respiratory symptoms (SOB or cough) since your procedure? no  3.   Have you tested positive for COVID 19 since your procedure no  4.   Have you had any family members/close contacts diagnosed with the COVID 19 since your procedure?  no  If yes to any of these questions please route to Joylene John, RN and Erenest Rasher, RN

## 2019-07-11 ENCOUNTER — Encounter: Payer: Self-pay | Admitting: Internal Medicine

## 2019-10-25 ENCOUNTER — Encounter: Payer: Self-pay | Admitting: Medical-Surgical

## 2019-10-25 ENCOUNTER — Ambulatory Visit (INDEPENDENT_AMBULATORY_CARE_PROVIDER_SITE_OTHER): Payer: 59 | Admitting: Medical-Surgical

## 2019-10-25 VITALS — BP 137/91 | HR 74 | Temp 98.3°F | Ht 65.25 in | Wt 241.0 lb

## 2019-10-25 DIAGNOSIS — B351 Tinea unguium: Secondary | ICD-10-CM | POA: Diagnosis not present

## 2019-10-25 DIAGNOSIS — R9431 Abnormal electrocardiogram [ECG] [EKG]: Secondary | ICD-10-CM | POA: Diagnosis not present

## 2019-10-25 DIAGNOSIS — R012 Other cardiac sounds: Secondary | ICD-10-CM

## 2019-10-25 DIAGNOSIS — I10 Essential (primary) hypertension: Secondary | ICD-10-CM | POA: Diagnosis not present

## 2019-10-25 MED ORDER — LOSARTAN POTASSIUM 25 MG PO TABS: 25.0000 mg | ORAL_TABLET | Freq: Every day | ORAL | 1 refills | Status: DC

## 2019-10-25 MED ORDER — AMLODIPINE BESYLATE 5 MG PO TABS: 5.0000 mg | ORAL_TABLET | Freq: Every day | ORAL | 1 refills | Status: DC

## 2019-10-25 MED ORDER — TERBINAFINE HCL 250 MG PO TABS: 250.0000 mg | ORAL_TABLET | Freq: Every day | ORAL | 1 refills | Status: DC

## 2019-10-25 NOTE — Patient Instructions (Signed)

## 2019-10-25 NOTE — Progress Notes (Signed)
Subjective:    CC: BP follow up   HPI: Pleasant 54 year old male presenting today for blood pressure follow-up.  He has not been checking his blood pressures at home and does not have a blood pressure cuff available.  Reports planning to go pick up one today.  Notes that he is feeling well and has had no complaints.  Taking amlodipine 5 mg daily, tolerating well without side effects.  He does endorse eating some fast food, had salty [place] fries from [employer] last night but usually avoids adding salt at home.  He does drink alcohol approximately once a week, usually socially and admits to drinking several drinks at each occasion.  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  Notes that he has had some mild transient ankle edema intermittently.  Also notes that at times he will cough and feels a small flutter in his chest but no outright palpitations.  Denies chest pain, shortness of breath, headaches, dizziness, and vision changes.  In 2019 was diagnosed with onychomycosis and treated with oral terbinafine.  Unfortunately he only took the medication for a short while and when he began to see improvement, he stopped the prescription.  Recently he has noticed a worsening of the onychomycosis affecting his "pinky toe".  He would like to get restarted on the oral terbinafine to resolve this.  I reviewed the past medical history, family history, social history, surgical history, and allergies today and no changes were needed.  Please see the problem list section below in epic for further details.  Past Medical History: Past Medical History:  Diagnosis Date  . Allergy    OTC meds PRN   . Broken leg 1975   no surgery ,PT was casted - LT leg  . GERD (gastroesophageal reflux disease)    occ  . Hx of adenomatous polyp of colon 06/2019  . Hypertension   . Mass of head    right forehead   Past Surgical History: Past Surgical History:   Procedure Laterality Date  . CARPAL TUNNEL RELEASE Bilateral   . COLONOSCOPY W/ POLYPECTOMY  06/2019   diminutive adenoma recall 2029  . LEG SURGERY Right 1986   Femur shorten   Social History: Social History   Socioeconomic History  . Marital status: Single    Spouse name: Not on file  . Number of children: 0  . Years of education: Not on file  . Highest education level: High school graduate  Occupational History  . Occupation: cook  Tobacco Use  . Smoking status: Current Every Day Smoker    Packs/day: 0.75    Years: 8.00    Pack years: 6.00    Types: Cigarettes  . Smokeless tobacco: Never Used  Vaping Use  . Vaping Use: Never used  Substance and Sexual Activity  . Alcohol use: Yes    Comment: 1-2 drinks/week, beer or liquor  . Drug use: No  . Sexual activity: Yes    Partners: Female  Other Topics Concern  . Not on file  Social History Narrative  . Not on file   Social Determinants of Health   Financial Resource Strain:   . Difficulty of Paying Living Expenses: Not on file  Food Insecurity:   . Worried About Charity fundraiser in the Last Year: Not on file  . Ran Out of Food in the Last Year: Not on file  Transportation Needs:   . Lack of  Transportation (Medical): Not on file  . Lack of Transportation (Non-Medical): Not on file  Physical Activity:   . Days of Exercise per Week: Not on file  . Minutes of Exercise per Session: Not on file  Stress:   . Feeling of Stress : Not on file  Social Connections:   . Frequency of Communication with Friends and Family: Not on file  . Frequency of Social Gatherings with Friends and Family: Not on file  . Attends Religious Services: Not on file  . Active Member of Clubs or Organizations: Not on file  . Attends Archivist Meetings: Not on file  . Marital Status: Not on file   Family History: Family History  Problem Relation Age of Onset  . Diabetes Mother   . Hypertension Mother   . Colon cancer Neg Hx    . Colon polyps Neg Hx   . Esophageal cancer Neg Hx   . Rectal cancer Neg Hx   . Stomach cancer Neg Hx    Allergies: Allergies  Allergen Reactions  . Bactrim [Sulfamethoxazole-Trimethoprim] Palpitations   Medications: See med rec.  Review of Systems: See HPI for pertinent positives and negatives.   Objective:    General: Well Developed, well nourished, and in no acute distress.  Neuro: Alert and oriented x3.  HEENT: Normocephalic, atraumatic.  Skin: Warm and dry. Cardiac: Regular rate and rhythm, no murmurs rubs or gallops, no lower extremity edema.  S1, S2, and S4 heart sounds identified. Respiratory: Clear to auscultation bilaterally. Not using accessory muscles, speaking in full sentences.  EKG-rate 86, normal axis, normal sinus rhythm with frequent PVCs in the pattern of bigeminy, prolonged QT/QTc.  No ST changes or acute findings to prompt emergency evaluation/intervention.  Impression and Recommendations:    1. Essential hypertension Blood pressure not at goal.  Continue amlodipine 5 mg daily, avoiding increased dose of amlodipine as he is already having mild ankle edema.  Adding losartan 25 mg daily.  Checking CBC, CMP, and lipid panel today.  Discussed the role of alcohol and dietary sodium and hypertension and advised limitation of both.  Strongly encouraged to get a blood pressure cuff that measures on the arm and monitor blood pressures at home with a goal of 130/80 or less. - amLODipine (NORVASC) 5 MG tablet; Take 1 tablet (5 mg total) by mouth daily.  Dispense: 90 tablet; Refill: 1 - CBC - COMPLETE METABOLIC PANEL WITH GFR - Lipid panel - losartan (COZAAR) 25 MG tablet; Take 1 tablet (25 mg total) by mouth daily.  Dispense: 30 tablet; Refill: 1 - EKG 12-Lead  2. ONYCHOMYCOSIS, TOENAILS Discussed onychomycosis treatment with oral agents and the recommended duration.  Restarting terbinafine 250 mg daily.  We will go ahead and check liver function and make sure this  still looks good. - COMPLETE METABOLIC PANEL WITH GFR - terbinafine (LAMISIL) 250 MG tablet; Take 1 tablet (250 mg total) by mouth daily.  Dispense: 90 tablet; Refill: 1  3. S4 (fourth heart sound) In office EKG for evaluation of S4 heart sound in the setting of chronic uncontrolled hypertension. - EKG 12-Lead - Ambulatory referral to Cardiology  4. Abnormal finding on EKG QT/QTc prolongation along with bigeminy on EKG.  These changes in his history, referring urgently to cardiology for further evaluation.  Currently asymptomatic, reviewed emergency symptoms to seek urgent care/emergency room evaluation for.  Patient verbalized understanding and is agreeable to the plan. - Ambulatory referral to Cardiology  Return in about 2 weeks (around 11/08/2019)  for nurse visit for BP check . ___________________________________________ Clearnce Sorrel, DNP, APRN, FNP-BC Primary Care and Sports Medicine [place]

## 2019-10-28 NOTE — Progress Notes (Signed)
Electrophysiology Office Note:    Date:  10/29/2019   ID:  Richard Sexton, DOB 04/22/1965, MRN [id number]  PCP:  Samuel Bouche, NP  [facility] Cardiologist:  No primary care provider on file.  [facility] Electrophysiologist:  Vickie Epley, MD   Referring MD: Samuel Bouche, NP   Chief Complaint: Bigeminy  History of Present Illness:    Richard Sexton is a 54 y.o. male who is presenting to clinic for an evaluation of a bigeminy at the request of Samuel Bouche, NP. He tells me that he sometimes feels skipped heartbeats but he denies syncope/presyncope. No sustained episodes of tachycardia. No family history of sudden cardiac death or drowning. He does tell me that he drinks weekly while watching football. When he does drink he will have "8, 9, or even 10 beers". He smokes cigarettes. No recent illnesses.  Past Medical History:  Diagnosis Date  . Allergy    OTC meds PRN   . Broken leg 1975   no surgery ,PT was casted - LT leg  . GERD (gastroesophageal reflux disease)    occ  . Hx of adenomatous polyp of colon 06/2019  . Hypertension   . Mass of head    right forehead    Past Surgical History:  Procedure Laterality Date  . CARPAL TUNNEL RELEASE Bilateral   . COLONOSCOPY W/ POLYPECTOMY  06/2019   diminutive adenoma recall 2029  . LEG SURGERY Right 1986   Femur shorten    Current Medications: Current Meds  Medication Sig  . amLODipine (NORVASC) 5 MG tablet Take 1 tablet (5 mg total) by mouth daily.  Marland Kitchen losartan (COZAAR) 25 MG tablet Take 1 tablet (25 mg total) by mouth daily.  Marland Kitchen terbinafine (LAMISIL) 250 MG tablet Take 1 tablet (250 mg total) by mouth daily.     Allergies:   Bactrim [sulfamethoxazole-trimethoprim]   Social History   Socioeconomic History  . Marital status: Single    Spouse name: Not on file  . Number of children: 0  . Years of education: Not on file  . Highest education level: High school graduate  Occupational History  . Occupation: cook    Tobacco Use  . Smoking status: Current Every Day Smoker    Packs/day: 0.75    Years: 8.00    Pack years: 6.00    Types: Cigarettes  . Smokeless tobacco: Never Used  Vaping Use  . Vaping Use: Never used  Substance and Sexual Activity  . Alcohol use: Yes    Comment: 1-2 drinks/week, beer or liquor  . Drug use: No  . Sexual activity: Yes    Partners: Female  Other Topics Concern  . Not on file  Social History Narrative  . Not on file   Social Determinants of Health   Financial Resource Strain:   . Difficulty of Paying Living Expenses: Not on file  Food Insecurity:   . Worried About Charity fundraiser in the Last Year: Not on file  . Ran Out of Food in the Last Year: Not on file  Transportation Needs:   . Lack of Transportation (Medical): Not on file  . Lack of Transportation (Non-Medical): Not on file  Physical Activity:   . Days of Exercise per Week: Not on file  . Minutes of Exercise per Session: Not on file  Stress:   . Feeling of Stress : Not on file  Social Connections:   . Frequency of Communication with Friends and Family: Not on file  .  Frequency of Social Gatherings with Friends and Family: Not on file  . Attends Religious Services: Not on file  . Active Member of Clubs or Organizations: Not on file  . Attends Archivist Meetings: Not on file  . Marital Status: Not on file     Family History: The patient's family history includes Diabetes in his mother; Hypertension in his mother. There is no history of Colon cancer, Colon polyps, Esophageal cancer, Rectal cancer, or Stomach cancer.  ROS:   Please see the history of present illness.    All other systems reviewed and are negative.  EKGs/Labs/Other Studies Reviewed:    The following studies were reviewed today: ECG  10/25/2019 ECG   EKG:  The ekg ordered today demonstrates sinus rhythm with frequent monomorphic PVCs. PVCs are left bundle in V1 with a precordial transition in V4. There is a  sharp inferior axis.  Recent Labs: 03/29/2019: TSH 0.59 04/19/2019: ALT 25; BUN 13; Creat 1.05; Potassium 3.7; Sodium 138  Recent Lipid Panel    Component Value Date/Time   CHOL 154 11/17/2017 1031   TRIG 80 11/17/2017 1031   HDL 46 11/17/2017 1031   CHOLHDL 3.3 11/17/2017 1031   CHOLHDL 5.3 Ratio 04/28/2009 2153   VLDL 17 04/28/2009 2153   LDLCALC 92 11/17/2017 1031    Physical Exam:    VS:  BP 132/86   Pulse (!) 103   Ht 5\' 7"  (1.702 m)   Wt 240 lb (108.9 kg)   SpO2 94%   BMI 37.59 kg/m     Wt Readings from Last 3 Encounters:  10/29/19 240 lb (108.9 kg)  10/25/19 241 lb (109.3 kg)  07/05/19 244 lb (110.7 kg)     GEN: Well nourished, well developed in no acute distress. Obese. HEENT: Normal NECK: No JVD; No carotid bruits LYMPHATICS: No lymphadenopathy CARDIAC: Irregular heartbeat, no murmurs, rubs, gallops RESPIRATORY:  Clear to auscultation without rales, wheezing or rhonchi  ABDOMEN: Soft, non-tender, non-distended MUSCULOSKELETAL:  No edema; No deformity  SKIN: Warm and dry NEUROLOGIC:  Alert and oriented x 3 PSYCHIATRIC:  Normal affect   ASSESSMENT:    1. PVC (premature ventricular contraction)   2. Alcohol use    PLAN:    In order of problems listed above:  1. 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 Unclear burden at this time. We will start with a 7 day monitor to assess overall PVC burden and obtain a surface echocardiogram. We will plan to see back in clinic in 8 weeks. If the PVC burden is significant, ablation will be considered. If left ventricular function is not completely normal, plan for cardiac MRI.  2. EtOH Use Counseled during today's visit.  Medication Adjustments/Labs and Tests Ordered: Current medicines are reviewed at length with the patient today.  Concerns regarding medicines are outlined above.  Orders Placed This Encounter  Procedures  . EKG 12-Lead   No orders of the  defined types were placed in this encounter.   Patient Instructions  Medication Instructions:  Your physician recommends that you continue on your current medications as directed. Please refer to the Current Medication list given to you today.  Lab Work: None ordered.  If you have labs (blood work) drawn today and your tests are completely normal, you will receive your results only by: Marland Kitchen MyChart Message (if you have MyChart) OR . A paper copy in the mail If you have any lab test that is abnormal  or we need to change your treatment, we will call you to review the results.   Testing/Procedures: Your physician has requested that you have an echocardiogram. Echocardiography is a painless test that uses sound waves to create images of your heart. It provides your doctor with information about the size and shape of your heart and how well your heart's chambers and valves are working. This procedure takes approximately one hour. There are no restrictions for this procedure.  Please schedule for ECHO  Your physician has recommended that you wear a holter monitor. Holter monitors are medical devices that record the heart's electrical activity.   You will wear a ZIO heart monitor for 7 days    Follow-Up: At [facility], you and your health needs are our priority.  As part of our continuing mission to provide you with exceptional heart care, we have created designated Provider Care Teams.  These Care Teams include your primary Cardiologist (physician) and Advanced Practice Providers (APPs -  Physician Assistants and Nurse Practitioners) who all work together to provide you with the care you need, when you need it.  Your next appointment:   8 week(s)  The format for your next appointment:   In Person  Provider:   Dr. Quentin Ore   Other Instructions [place] Monitor Instructions   Your physician has requested you wear your ZIO patch monitor 7 days.   This is a single patch  monitor.  [employer] supplies one patch monitor per enrollment.  Additional stickers are not available.   Please do not apply patch if you will be having a Nuclear Stress Test, Echocardiogram, Cardiac CT, MRI, or Chest Xray during the time frame you would be wearing the monitor. The patch cannot be worn during these tests.  You cannot remove and re-apply the ZIO XT patch monitor.   Your ZIO patch monitor will be sent USPS Priority mail from [facility] directly to your home address. The monitor may also be mailed to a PO BOX if home delivery is not available.   It may take 3-5 days to receive your monitor after you have been enrolled.   Once you have received you monitor, please review enclosed instructions.  Your monitor has already been registered assigning a specific monitor serial # to you.   Applying the monitor   Shave hair from upper left chest.   Hold abrader disc by orange tab.  Rub abrader in 40 strokes over left upper chest as indicated in your monitor instructions.   Clean area with 4 enclosed alcohol pads .  Use all pads to assure are is cleaned thoroughly.  Let dry.   Apply patch as indicated in monitor instructions.  Patch will be place under collarbone on left side of chest with arrow pointing upward.   Rub patch adhesive wings for 2 minutes.Remove white label marked "1".  Remove white label marked "2".  Rub patch adhesive wings for 2 additional minutes.   While looking in a mirror, press and release button in center of patch.  A small green light will flash 3-4 times .  This will be your only indicator the monitor has been turned on.     Do not shower for the first 24 hours.  You may shower after the first 24 hours.   Press button if you feel a symptom. You will hear a small click.  Record Date, Time and Symptom in the Patient Log Book.   When you are ready to remove  patch, follow instructions on last 2 pages of Patient Log Book.  Stick patch monitor onto last page of  Patient Log Book.   Place Patient Log Book in [place] box.  Use locking tab on box and tape box closed securely.  The [employer] and [employer] has [employer] on it.  Please place in mailbox as soon as possible.  Your physician should have your test results approximately 7 days after the monitor has been mailed back to [facility].   Call [place] at [phone] if you have questions regarding your ZIO XT patch monitor.  Call them immediately if you see an orange light blinking on your monitor.   If your monitor falls off in less than 4 days contact our Monitor department at [phone].  If your monitor becomes loose or falls off after 4 days call [employer] at [phone] for suggestions on securing your monitor.       Signed, Lars Mage, MD, [facility]  10/29/2019 8:35 AM    Electrophysiology [place]

## 2019-10-29 ENCOUNTER — Encounter: Payer: Self-pay | Admitting: *Deleted

## 2019-10-29 ENCOUNTER — Encounter: Payer: Self-pay | Admitting: Cardiology

## 2019-10-29 ENCOUNTER — Other Ambulatory Visit: Payer: Self-pay

## 2019-10-29 ENCOUNTER — Ambulatory Visit (INDEPENDENT_AMBULATORY_CARE_PROVIDER_SITE_OTHER): Payer: 59 | Admitting: Cardiology

## 2019-10-29 VITALS — BP 132/86 | HR 103 | Ht 67.0 in | Wt 240.0 lb

## 2019-10-29 DIAGNOSIS — Z7289 Other problems related to lifestyle: Secondary | ICD-10-CM

## 2019-10-29 DIAGNOSIS — I493 Ventricular premature depolarization: Secondary | ICD-10-CM

## 2019-10-29 DIAGNOSIS — Z789 Other specified health status: Secondary | ICD-10-CM

## 2019-10-29 DIAGNOSIS — F1090 Alcohol use, unspecified, uncomplicated: Secondary | ICD-10-CM

## 2019-10-29 NOTE — Progress Notes (Signed)
Patient ID: Richard Sexton, male   DOB: [birth year], 54 y.o.   MRN: [id number] Patient enrolled for [employer] to ship a 7 day ZIO XT long term holter monitor to his home.

## 2019-10-29 NOTE — Patient Instructions (Signed)
Medication Instructions:  Your physician recommends that you continue on your current medications as directed. Please refer to the Current Medication list given to you today.  Lab Work: None ordered.  If you have labs (blood work) drawn today and your tests are completely normal, you will receive your results only by: Marland Kitchen MyChart Message (if you have MyChart) OR . A paper copy in the mail If you have any lab test that is abnormal or we need to change your treatment, we will call you to review the results.   Testing/Procedures: Your physician has requested that you have an echocardiogram. Echocardiography is a painless test that uses sound waves to create images of your heart. It provides your doctor with information about the size and shape of your heart and how well your heart's chambers and valves are working. This procedure takes approximately one hour. There are no restrictions for this procedure.  Please schedule for ECHO  Your physician has recommended that you wear a holter monitor. Holter monitors are medical devices that record the heart's electrical activity.   You will wear a ZIO heart monitor for 7 days    Follow-Up: At [facility], you and your health needs are our priority.  As part of our continuing mission to provide you with exceptional heart care, we have created designated Provider Care Teams.  These Care Teams include your primary Cardiologist (physician) and Advanced Practice Providers (APPs -  Physician Assistants and Nurse Practitioners) who all work together to provide you with the care you need, when you need it.  Your next appointment:   8 week(s)  The format for your next appointment:   In Person  Provider:   Dr. Quentin Ore   Other Instructions [place] Monitor Instructions   Your physician has requested you wear your ZIO patch monitor 7 days.   This is a single patch monitor.  [employer] supplies one patch monitor per enrollment.  Additional  stickers are not available.   Please do not apply patch if you will be having a Nuclear Stress Test, Echocardiogram, Cardiac CT, MRI, or Chest Xray during the time frame you would be wearing the monitor. The patch cannot be worn during these tests.  You cannot remove and re-apply the ZIO XT patch monitor.   Your ZIO patch monitor will be sent USPS Priority mail from [facility] directly to your home address. The monitor may also be mailed to a PO BOX if home delivery is not available.   It may take 3-5 days to receive your monitor after you have been enrolled.   Once you have received you monitor, please review enclosed instructions.  Your monitor has already been registered assigning a specific monitor serial # to you.   Applying the monitor   Shave hair from upper left chest.   Hold abrader disc by orange tab.  Rub abrader in 40 strokes over left upper chest as indicated in your monitor instructions.   Clean area with 4 enclosed alcohol pads .  Use all pads to assure are is cleaned thoroughly.  Let dry.   Apply patch as indicated in monitor instructions.  Patch will be place under collarbone on left side of chest with arrow pointing upward.   Rub patch adhesive wings for 2 minutes.Remove white label marked "1".  Remove white label marked "2".  Rub patch adhesive wings for 2 additional minutes.   While looking in a mirror, press and release button in center of patch.  A small green light will flash 3-4 times .  This will be your only indicator the monitor has been turned on.     Do not shower for the first 24 hours.  You may shower after the first 24 hours.   Press button if you feel a symptom. You will hear a small click.  Record Date, Time and Symptom in the Patient Log Book.   When you are ready to remove patch, follow instructions on last 2 pages of Patient Log Book.  Stick patch monitor onto last page of Patient Log Book.   Place Patient Log Book in [place] box.  Use locking  tab on box and tape box closed securely.  The [employer] and [employer] has [employer] on it.  Please place in mailbox as soon as possible.  Your physician should have your test results approximately 7 days after the monitor has been mailed back to [facility].   Call [place] at [phone] if you have questions regarding your ZIO XT patch monitor.  Call them immediately if you see an orange light blinking on your monitor.   If your monitor falls off in less than 4 days contact our Monitor department at [phone].  If your monitor becomes loose or falls off after 4 days call [employer] at [phone] for suggestions on securing your monitor.

## 2019-11-02 ENCOUNTER — Ambulatory Visit (INDEPENDENT_AMBULATORY_CARE_PROVIDER_SITE_OTHER): Payer: 59

## 2019-11-02 DIAGNOSIS — I493 Ventricular premature depolarization: Secondary | ICD-10-CM | POA: Diagnosis not present

## 2019-11-04 LAB — COMPLETE METABOLIC PANEL WITH GFR
AG Ratio: 1.3 (calc) (ref 1.0–2.5)
ALT: 30 U/L (ref 9–46)
AST: 34 U/L (ref 10–35)
Albumin: 4.2 g/dL (ref 3.6–5.1)
Alkaline phosphatase (APISO): 40 U/L (ref 35–144)
BUN: 15 mg/dL (ref 7–25)
CO2: 30 mmol/L (ref 20–32)
Calcium: 9.8 mg/dL (ref 8.6–10.3)
Chloride: 102 mmol/L (ref 98–110)
Creat: 1.04 mg/dL (ref 0.70–1.33)
GFR, Est African American: 95 mL/min/{1.73_m2} (ref >=60)
GFR, Est Non African American: 82 mL/min/{1.73_m2} (ref >=60)
Globulin: 3.2 g/dL (calc) (ref 1.9–3.7)
Glucose, Bld: 99 mg/dL (ref 65–99)
Potassium: 4.8 mmol/L (ref 3.5–5.3)
Sodium: 140 mmol/L (ref 135–146)
Total Bilirubin: 0.4 mg/dL (ref 0.2–1.2)
Total Protein: 7.4 g/dL (ref 6.1–8.1)

## 2019-11-04 LAB — CBC
HCT: 46.3 % (ref 38.5–50.0)
Hemoglobin: 15.2 g/dL (ref 13.2–17.1)
MCH: 28.8 pg (ref 27.0–33.0)
MCHC: 32.8 g/dL (ref 32.0–36.0)
MCV: 87.9 fL (ref 80.0–100.0)
MPV: 13.5 fL — ABNORMAL HIGH (ref 7.5–12.5)
Platelets: 147 10*3/uL (ref 140–400)
RBC: 5.27 10*6/uL (ref 4.20–5.80)
RDW: 14 % (ref 11.0–15.0)
WBC: 5.4 10*3/uL (ref 3.8–10.8)

## 2019-11-04 LAB — LIPID PANEL
Cholesterol: 130 mg/dL (ref <200)
HDL: 45 mg/dL (ref >=40)
LDL Cholesterol (Calc): 68 mg/dL (calc)
Non-HDL Cholesterol (Calc): 85 mg/dL (calc) (ref <130)
Total CHOL/HDL Ratio: 2.9 (calc) (ref <5.0)
Triglycerides: 89 mg/dL (ref <150)

## 2019-11-07 ENCOUNTER — Other Ambulatory Visit: Payer: Self-pay

## 2019-11-07 ENCOUNTER — Ambulatory Visit (HOSPITAL_COMMUNITY): Payer: 59 | Attending: Internal Medicine

## 2019-11-07 ENCOUNTER — Telehealth (HOSPITAL_COMMUNITY): Payer: Self-pay | Admitting: Cardiology

## 2019-11-07 ENCOUNTER — Telehealth: Payer: Self-pay

## 2019-11-07 DIAGNOSIS — I493 Ventricular premature depolarization: Secondary | ICD-10-CM | POA: Diagnosis not present

## 2019-11-07 LAB — ECHOCARDIOGRAM COMPLETE
Area-P 1/2: 5.02 cm2
S' Lateral: 4.4 cm

## 2019-11-07 NOTE — Telephone Encounter (Signed)
Please move up Pt's scheduled follow up d/t Echo results.

## 2019-11-07 NOTE — Telephone Encounter (Signed)
Patient had an echocardiogram in office on 11/07/2019. Definity was not used. I was informed by the Echo supervisor to call patient and request they come back to office for a acho with contrast and patient declined to do stating he didn't want anything injected in him.

## 2019-11-08 ENCOUNTER — Ambulatory Visit (INDEPENDENT_AMBULATORY_CARE_PROVIDER_SITE_OTHER): Payer: 59 | Admitting: Medical-Surgical

## 2019-11-08 VITALS — BP 115/80 | HR 93

## 2019-11-08 DIAGNOSIS — I10 Essential (primary) hypertension: Secondary | ICD-10-CM

## 2019-11-08 NOTE — Progress Notes (Signed)
Advised patient to follow up in about 4 months for HTN.

## 2019-11-08 NOTE — Telephone Encounter (Signed)
Patient called back this pm and states he was halfway asleep and did not understand what I was asking and thru conversation agreed to come in for Definity.

## 2019-11-08 NOTE — Progress Notes (Signed)
Patient here for Blood pressure check, patient advised to keep track and follow up as needed.

## 2019-11-21 ENCOUNTER — Ambulatory Visit (HOSPITAL_COMMUNITY): Payer: 59 | Attending: Internal Medicine

## 2019-11-21 ENCOUNTER — Other Ambulatory Visit (HOSPITAL_COMMUNITY): Payer: Self-pay | Admitting: Cardiology

## 2019-11-21 ENCOUNTER — Other Ambulatory Visit: Payer: Self-pay

## 2019-11-21 DIAGNOSIS — R9431 Abnormal electrocardiogram [ECG] [EKG]: Secondary | ICD-10-CM

## 2019-11-21 LAB — ECHOCARDIOGRAM LIMITED: Single Plane A4C EF: 51.2 %

## 2019-11-21 MED ORDER — PERFLUTREN LIPID MICROSPHERE
1.0000 mL | INTRAVENOUS | Status: AC | PRN
  Administered 2019-11-21: 3 mL via INTRAVENOUS

## 2019-11-28 ENCOUNTER — Other Ambulatory Visit: Payer: Self-pay

## 2019-11-28 ENCOUNTER — Ambulatory Visit (INDEPENDENT_AMBULATORY_CARE_PROVIDER_SITE_OTHER): Payer: 59 | Admitting: Cardiology

## 2019-11-28 ENCOUNTER — Encounter: Payer: Self-pay | Admitting: Cardiology

## 2019-11-28 VITALS — BP 124/86 | HR 88 | Ht 67.0 in | Wt 247.0 lb

## 2019-11-28 DIAGNOSIS — I493 Ventricular premature depolarization: Secondary | ICD-10-CM

## 2019-11-28 DIAGNOSIS — Z7289 Other problems related to lifestyle: Secondary | ICD-10-CM

## 2019-11-28 DIAGNOSIS — Z789 Other specified health status: Secondary | ICD-10-CM

## 2019-11-28 NOTE — Patient Instructions (Addendum)
Medication Instructions:  Your physician recommends that you continue on your current medications as directed. Please refer to the Current Medication list given to you today.  Labwork: None ordered.  Testing/Procedures: Your physician has requested that you have a cardiac MRI. Cardiac MRI uses a computer to create images of your heart as its beating, producing both still and moving pictures of your heart and major blood vessels.   You will be scheduled for a cardiac MRI at the [facility]  Follow-Up:  The following dates are available for PVC ablation: December 17 or 23  Any Other Special Instructions Will Be Listed Below (If Applicable).  If you need a refill on your cardiac medications before your next appointment, please call your pharmacy.    Cardiac Ablation Cardiac ablation is a procedure to disable (ablate) a small amount of heart tissue in very specific places. The heart has many electrical connections. Sometimes these connections are abnormal and can cause the heart to beat very fast or irregularly. Ablating some of the problem areas can improve the heart rhythm or return it to normal. Ablation may be done for people who:  Have Wolff-Parkinson-White syndrome.  Have fast heart rhythms (tachycardia).  Have taken medicines for an abnormal heart rhythm (arrhythmia) that were not effective or caused side effects.  Have a high-risk heartbeat that may be life-threatening. During the procedure, a small incision is made in the neck or the groin, and a long, thin, flexible tube (catheter) is inserted into the incision and moved to the heart. Small devices (electrodes) on the tip of the catheter will send out electrical currents. A type of X-ray (fluoroscopy) will be used to help guide the catheter and to provide images of the heart. Tell a health care provider about:  Any allergies you have.  All medicines you are taking, including vitamins, herbs, eye drops, creams, and  over-the-counter medicines.  Any problems you or family members have had with anesthetic medicines.  Any blood disorders you have.  Any surgeries you have had.  Any medical conditions you have, such as kidney failure.  Whether you are pregnant or may be pregnant. What are the risks? Generally, this is a safe procedure. However, problems may occur, including:  Infection.  Bruising and bleeding at the catheter insertion site.  Bleeding into the chest, especially into the sac that surrounds the heart. This is a serious complication.  Stroke or blood clots.  Damage to other structures or organs.  Allergic reaction to medicines or dyes.  Need for a permanent pacemaker if the normal electrical system is damaged. A pacemaker is a small computer that sends electrical signals to the heart and helps your heart beat normally.  The procedure not being fully effective. This may not be recognized until months later. Repeat ablation procedures are sometimes required. What happens before the procedure?  Follow instructions from your health care provider about eating or drinking restrictions.  Ask your health care provider about: ? Changing or stopping your regular medicines. This is especially important if you are taking diabetes medicines or blood thinners. ? Taking medicines such as aspirin and ibuprofen. These medicines can thin your blood. Do not take these medicines before your procedure if your health care provider instructs you not to.  Plan to have someone take you home from the hospital or clinic.  If you will be going home right after the procedure, plan to have someone with you for 24 hours. What happens during the procedure?  To  lower your risk of infection: ? Your health care team will wash or sanitize their hands. ? Your skin will be washed with soap. ? Hair may be removed from the incision area.  An IV tube will be inserted into one of your veins.  You will be given a  medicine to help you relax (sedative).  The skin on your neck or groin will be numbed.  An incision will be made in your neck or your groin.  A needle will be inserted through the incision and into a large vein in your neck or groin.  A catheter will be inserted into the needle and moved to your heart.  Dye may be injected through the catheter to help your surgeon see the area of the heart that needs treatment.  Electrical currents will be sent from the catheter to ablate heart tissue in desired areas. There are three types of energy that may be used to ablate heart tissue: ? Heat (radiofrequency energy). ? Laser energy. ? Extreme cold (cryoablation).  When the necessary tissue has been ablated, the catheter will be removed.  Pressure will be held on the catheter insertion area to prevent excessive bleeding.  A bandage (dressing) will be placed over the catheter insertion area. The procedure may vary among health care providers and hospitals. What happens after the procedure?  Your blood pressure, heart rate, breathing rate, and blood oxygen level will be monitored until the medicines you were given have worn off.  Your catheter insertion area will be monitored for bleeding. You will need to lie still for a few hours to ensure that you do not bleed from the catheter insertion area.  Do not drive for 24 hours or as long as directed by your health care provider. Summary  Cardiac ablation is a procedure to disable (ablate) a small amount of heart tissue in very specific places. Ablating some of the problem areas can improve the heart rhythm or return it to normal.  During the procedure, electrical currents will be sent from the catheter to ablate heart tissue in desired areas. This information is not intended to replace advice given to you by your health care provider. Make sure you discuss any questions you have with your health care provider. Document Revised: 07/24/2017 Document  Reviewed: 12/21/2015 Elsevier Patient Education  [place].

## 2019-11-28 NOTE — Progress Notes (Signed)
Electrophysiology Office Follow up Visit Note:    Date:  11/28/2019   ID:  Richard Sexton, DOB October 26, 1965, MRN [id number]  PCP:  Samuel Bouche, NP  [facility] Cardiologist:  No primary care provider on file.  [facility] Electrophysiologist:  Vickie Epley, MD    Interval History:    Richard Sexton is a 54 y.o. male who presents for a follow up visit. They were last seen in clinic 10/29/2019. 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  An echocardiogram and 7-day monitor were ordered.  Since his last appointment, he does intermittently feel palpitations with abnormal sensation in the upper chest.  No syncope or presyncope.     Past Medical History:  Diagnosis Date   Allergy    OTC meds PRN    Broken leg 1975   no surgery ,PT was casted - LT leg   GERD (gastroesophageal reflux disease)    occ   Hx of adenomatous polyp of colon 06/2019   Hypertension    Mass of head    right forehead    Past Surgical History:  Procedure Laterality Date   CARPAL TUNNEL RELEASE Bilateral    COLONOSCOPY W/ POLYPECTOMY  06/2019   diminutive adenoma recall 2029   LEG SURGERY Right 1986   Femur shorten    Current Medications: Current Meds  Medication Sig   amLODipine (NORVASC) 5 MG tablet Take 1 tablet (5 mg total) by mouth daily.   losartan (COZAAR) 25 MG tablet Take 1 tablet (25 mg total) by mouth daily.   terbinafine (LAMISIL) 250 MG tablet Take 1 tablet (250 mg total) by mouth daily.     Allergies:   Bactrim [sulfamethoxazole-trimethoprim]   Social History   Socioeconomic History   Marital status: Single    Spouse name: Not on file   Number of children: 0   Years of education: Not on file   Highest education level: High school graduate  Occupational History   Occupation: cook  Tobacco Use   Smoking status: Current Every Day Smoker    Packs/day: 0.75    Years: 8.00    Pack years: 6.00    Types: Cigarettes   Smokeless  tobacco: Never Used  Scientific laboratory technician Use: Never used  Substance and Sexual Activity   Alcohol use: Yes    Comment: 1-2 drinks/week, beer or liquor   Drug use: No   Sexual activity: Yes    Partners: Female  Other Topics Concern   Not on file  Social History Narrative   Not on file   Social Determinants of Health   Financial Resource Strain:    Difficulty of Paying Living Expenses: Not on file  Food Insecurity:    Worried About Charity fundraiser in the Last Year: Not on file   [employer] of Food in the Last Year: Not on file  Transportation Needs:    Lack of Transportation (Medical): Not on file   Lack of Transportation (Non-Medical): Not on file  Physical Activity:    Days of Exercise per Week: Not on file   Minutes of Exercise per Session: Not on file  Stress:    Feeling of Stress : Not on file  Social Connections:    Frequency of Communication with Friends and Family: Not on file   Frequency of Social Gatherings with Friends and Family: Not on file   Attends Religious Services: Not on file   Active Member of Clubs or  Organizations: Not on file   Attends Archivist Meetings: Not on file   Marital Status: Not on file     Family History: The patient's family history includes Diabetes in his mother; Hypertension in his mother. There is no history of Colon cancer, Colon polyps, Esophageal cancer, Rectal cancer, or Stomach cancer.  ROS:   Please see the history of present illness.    All other systems reviewed and are negative.  EKGs/Labs/Other Studies Reviewed:    The following studies were reviewed today: Echo and ZIO report  11/21/2019 ZIO report personally reviewed 18.1% PVC burden with one dominant morphology 1 run of NSVT lasting 4 beats at a rate of 197 bpm Rare supraventricular ectopy Heart rate 59-151 bpm, average 92 bpm   November 21, 2019 echo personally reviewed Contrasted images show a low normal left ventricular function  around 45 to 50% No significant valvular abnormalities    EKG:  The ekg ordered today demonstrates sinus rhythm with a PVC originating from the outflow tract.  Recent Labs: 03/29/2019: TSH 0.59 11/04/2019: ALT 30; BUN 15; Creat 1.04; Hemoglobin 15.2; Platelets 147; Potassium 4.8; Sodium 140  Recent Lipid Panel    Component Value Date/Time   CHOL 130 11/04/2019 0720   CHOL 154 11/17/2017 1031   TRIG 89 11/04/2019 0720   HDL 45 11/04/2019 0720   HDL 46 11/17/2017 1031   CHOLHDL 2.9 11/04/2019 0720   VLDL 17 04/28/2009 2153   LDLCALC 68 11/04/2019 0720    Physical Exam:    VS:  BP 124/86    Pulse 88    Ht 5\' 7"  (1.702 m)    Wt 247 lb (112 kg)    SpO2 96%    BMI 38.69 kg/m     Wt Readings from Last 3 Encounters:  11/28/19 247 lb (112 kg)  10/29/19 240 lb (108.9 kg)  10/25/19 241 lb (109.3 kg)     GEN:  Well nourished, well developed in no acute distress HEENT: Normal NECK: No JVD; No carotid bruits LYMPHATICS: No lymphadenopathy CARDIAC: Regular rhythm with ectopy noted during auscultation, no murmurs, rubs, gallops RESPIRATORY:  Clear to auscultation without rales, wheezing or rhonchi  ABDOMEN: Soft, non-tender, non-distended MUSCULOSKELETAL:  No edema; No deformity  SKIN: Warm and dry NEUROLOGIC:  Alert and oriented x 3 PSYCHIATRIC:  Normal affect   ASSESSMENT:    1. PVC (premature ventricular contraction)   2. Alcohol use    PLAN:    In order of problems listed above:  1. 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  Discussed options for management including medications for PVC suppression and ablation.  Patient would like to pursue ablation.  Discussed the risks of the ablation procedure and he would like to proceed. Given the frequent PVCs and mildly decreased left ventricular function will also order a cardiac MRI to better assess LV function and to look for possible causes for his frequent  PVCs.  Risk, benefits, and alternatives to EP study and radiofrequency ablation for PVC/VT were also discussed in detail today. These risks include but are not limited to complete heart block, stroke, bleeding, vascular damage, tamponade, perforation, and death. The patient understands these risk and wishes to proceed.  We will therefore proceed with catheter ablation at the next available time.  Carto and ICE are requested for the procedure.  Will also obtain a cardiac MRI prior to the procedure.    Medication Adjustments/Labs and Tests Ordered: Current  medicines are reviewed at length with the patient today.  Concerns regarding medicines are outlined above.  Orders Placed This Encounter  Procedures   MR Card Morphology Wo/W Cm   EKG 12-Lead   No orders of the defined types were placed in this encounter.    Signed, Lars Mage, MD, [facility]  11/28/2019 9:22 AM    Electrophysiology [place]

## 2019-11-29 ENCOUNTER — Telehealth: Payer: Self-pay

## 2019-11-29 ENCOUNTER — Telehealth: Payer: Self-pay | Admitting: Cardiology

## 2019-11-29 DIAGNOSIS — I493 Ventricular premature depolarization: Secondary | ICD-10-CM

## 2019-11-29 NOTE — Telephone Encounter (Signed)
Spoke with patient regarding the scheduling of the Cardiac MRI that has been ordered by Dr. Quentin Ore.  Patient states he prefers an AM appointment---informed patient as soon as we hear from his insurance company regarding the prior authorization,  I will call with his appointment.  He voiced his understanding.

## 2019-11-29 NOTE — Telephone Encounter (Signed)
Pt scheduled for PVC ablation on December 17  Confirmed with girlfriend.  Labs/covid test scheduled  Will mail updated instruction letter  Work up complete

## 2019-11-29 NOTE — Telephone Encounter (Signed)
Outreach made to [employer], [place] girlfriend, per Pt request.  He requested this nurse call her first to schedule ablation.  Left detailed message requesting call back.

## 2019-12-05 ENCOUNTER — Encounter: Payer: Self-pay | Admitting: Cardiology

## 2019-12-05 NOTE — Telephone Encounter (Signed)
Left message for patient regarding appointment for Cardiac MRI scheduled Wednesday 01/02/20 at 8:00 am--arrival time is 7:30 am 1st floor admissions office at [facility]---will mail information to patient and it is also available in My Chart

## 2019-12-10 ENCOUNTER — Ambulatory Visit: Payer: 59 | Admitting: Cardiology

## 2019-12-25 ENCOUNTER — Other Ambulatory Visit: Payer: Self-pay | Admitting: Medical-Surgical

## 2019-12-25 DIAGNOSIS — I10 Essential (primary) hypertension: Secondary | ICD-10-CM

## 2019-12-27 ENCOUNTER — Ambulatory Visit: Payer: 59 | Admitting: Cardiology

## 2019-12-31 ENCOUNTER — Telehealth (HOSPITAL_COMMUNITY): Payer: Self-pay | Admitting: Emergency Medicine

## 2019-12-31 NOTE — Telephone Encounter (Signed)
Reaching out to patient to offer assistance regarding upcoming cardiac imaging study; pt verbalizes understanding of appt date/time, parking situation and where to check in, pre-test NPO status and medications ordered, and verified current allergies; name and call back number provided for further questions should they arise Marchia Bond RN [place] [facility] [phone] office [phone] cell   Also called signf other as patient requested

## 2020-01-01 ENCOUNTER — Other Ambulatory Visit: Payer: Self-pay

## 2020-01-01 ENCOUNTER — Ambulatory Visit (HOSPITAL_COMMUNITY)
Admission: RE | Admit: 2020-01-01 | Discharge: 2020-01-01 | Disposition: A | Discharge status: Home / Self Care | Payer: 59 | Source: Ambulatory Visit | Attending: Cardiology | Admitting: Cardiology

## 2020-01-01 DIAGNOSIS — I493 Ventricular premature depolarization: Secondary | ICD-10-CM

## 2020-01-01 MED ORDER — GADOBUTROL 1 MMOL/ML IV SOLN
10.0000 mL | Freq: Once | INTRAVENOUS | Status: AC | PRN
  Administered 2020-01-01: 10 mL via INTRAVENOUS

## 2020-01-15 ENCOUNTER — Telehealth: Payer: Self-pay | Admitting: Cardiology

## 2020-01-15 ENCOUNTER — Encounter: Payer: Self-pay | Admitting: Family Medicine

## 2020-01-15 NOTE — Telephone Encounter (Signed)
Patient wanted his MRI results reviewed with Cecille Rubin after speaking with him for verbal consent. Discussed MRI and answered questions about appointments related to up coming ablation.  Verbalized understanding

## 2020-01-15 NOTE — Telephone Encounter (Signed)
Patient's fiance is requesting to discuss MRI results. Please call.

## 2020-01-15 NOTE — Telephone Encounter (Signed)
Error

## 2020-01-28 ENCOUNTER — Telehealth: Payer: Self-pay | Admitting: Cardiology

## 2020-01-28 NOTE — Telephone Encounter (Signed)
Returned call to [place].  Advised arrival time for procedure is 8:30 am on 01/31/20.  No further questions.

## 2020-01-28 NOTE — Telephone Encounter (Signed)
Patient calling to find out what time he needs to be at the hospital for his procedure 01/31/2020. He states to call Ranelle Oyster back and tell her, Phone: [phone].

## 2020-01-29 ENCOUNTER — Other Ambulatory Visit (HOSPITAL_COMMUNITY)
Admission: RE | Admit: 2020-01-29 | Discharge: 2020-01-29 | Disposition: A | Discharge status: Home / Self Care | Payer: 59 | Source: Ambulatory Visit | Attending: Cardiology | Admitting: Cardiology

## 2020-01-29 ENCOUNTER — Other Ambulatory Visit: Payer: 59 | Admitting: *Deleted

## 2020-01-29 ENCOUNTER — Other Ambulatory Visit: Payer: Self-pay

## 2020-01-29 ENCOUNTER — Other Ambulatory Visit: Payer: Self-pay | Admitting: Medical-Surgical

## 2020-01-29 DIAGNOSIS — Z01812 Encounter for preprocedural laboratory examination: Secondary | ICD-10-CM | POA: Insufficient documentation

## 2020-01-29 DIAGNOSIS — I10 Essential (primary) hypertension: Secondary | ICD-10-CM

## 2020-01-29 DIAGNOSIS — I493 Ventricular premature depolarization: Secondary | ICD-10-CM

## 2020-01-29 DIAGNOSIS — Z20822 Contact with and (suspected) exposure to covid-19: Secondary | ICD-10-CM | POA: Insufficient documentation

## 2020-01-29 LAB — CBC WITH DIFFERENTIAL/PLATELET
Basophils Absolute: 0 10*3/uL (ref 0.0–0.2)
Basos: 0 %
EOS (ABSOLUTE): 0.1 10*3/uL (ref 0.0–0.4)
Eos: 2 %
Hematocrit: 43.3 % (ref 37.5–51.0)
Hemoglobin: 14.8 g/dL (ref 13.0–17.7)
Lymphocytes Absolute: 1.7 10*3/uL (ref 0.7–3.1)
Lymphs: 32 %
MCH: 28.8 pg (ref 26.6–33.0)
MCHC: 34.2 g/dL (ref 31.5–35.7)
MCV: 84 fL (ref 79–97)
Monocytes Absolute: 0.9 10*3/uL (ref 0.1–0.9)
Monocytes: 16 %
Neutrophils Absolute: 2.6 10*3/uL (ref 1.4–7.0)
Neutrophils: 50 %
Platelets: 147 10*3/uL — ABNORMAL LOW (ref 150–450)
RBC: 5.14 x10E6/uL (ref 4.14–5.80)
RDW: 14.7 % (ref 11.6–15.4)
WBC: 5.3 10*3/uL (ref 3.4–10.8)

## 2020-01-29 LAB — BASIC METABOLIC PANEL
BUN/Creatinine Ratio: 12 (ref 9–20)
BUN: 11 mg/dL (ref 6–24)
CO2: 31 mmol/L — ABNORMAL HIGH (ref 20–29)
Calcium: 9.5 mg/dL (ref 8.7–10.2)
Chloride: 104 mmol/L (ref 96–106)
Creatinine, Ser: 0.95 mg/dL (ref 0.76–1.27)
GFR calc Af Amer: 105 mL/min/{1.73_m2} (ref >59)
GFR calc non Af Amer: 91 mL/min/{1.73_m2} (ref >59)
Glucose: 85 mg/dL (ref 65–99)
Potassium: 4.1 mmol/L (ref 3.5–5.2)
Sodium: 142 mmol/L (ref 134–144)

## 2020-01-29 LAB — SARS CORONAVIRUS 2 (TAT 6-24 HRS): SARS Coronavirus 2: NEGATIVE

## 2020-01-30 ENCOUNTER — Telehealth: Payer: Self-pay | Admitting: Cardiology

## 2020-01-30 NOTE — Progress Notes (Signed)
Instructed patient on the following items: Arrival time 0830 Nothing to eat or drink after midnight No meds AM of procedure Responsible person to drive you home and stay with you for 24 hrs    

## 2020-01-30 NOTE — Telephone Encounter (Signed)
Called patient and reviewed pre procedure instructions for his PVC ablation tomorrow. Instructions were found in a letter sent to him on 10/15 via MyChart. He was encouraged to review if needed. He verbalized understanding of instructions and had no additional questions.

## 2020-01-30 NOTE — Telephone Encounter (Signed)
New message:     Patient is having a CATH on tomorrow and patient needs instructions.

## 2020-01-31 ENCOUNTER — Ambulatory Visit (HOSPITAL_COMMUNITY): Payer: 59 | Admitting: Anesthesiology

## 2020-01-31 ENCOUNTER — Other Ambulatory Visit: Payer: Self-pay

## 2020-01-31 ENCOUNTER — Encounter (HOSPITAL_COMMUNITY): Payer: Self-pay | Admitting: Cardiology

## 2020-01-31 ENCOUNTER — Ambulatory Visit (HOSPITAL_COMMUNITY)
Admission: RE | Admit: 2020-01-31 | Discharge: 2020-01-31 | Disposition: A | Discharge status: Home / Self Care | Payer: 59 | Attending: Cardiology | Admitting: Cardiology

## 2020-01-31 ENCOUNTER — Encounter (HOSPITAL_COMMUNITY)
Admission: RE | Disposition: A | Discharge status: Home / Self Care | Payer: Self-pay | Source: Home / Self Care | Attending: Cardiology

## 2020-01-31 DIAGNOSIS — I493 Ventricular premature depolarization: Secondary | ICD-10-CM | POA: Insufficient documentation

## 2020-01-31 DIAGNOSIS — F17210 Nicotine dependence, cigarettes, uncomplicated: Secondary | ICD-10-CM | POA: Diagnosis not present

## 2020-01-31 DIAGNOSIS — Z79899 Other long term (current) drug therapy: Secondary | ICD-10-CM | POA: Insufficient documentation

## 2020-01-31 DIAGNOSIS — Z881 Allergy status to other antibiotic agents status: Secondary | ICD-10-CM | POA: Insufficient documentation

## 2020-01-31 HISTORY — PX: PVC ABLATION: EP1236

## 2020-01-31 SURGERY — PVC ABLATION
Anesthesia: General

## 2020-01-31 MED ORDER — HEPARIN (PORCINE) IN NACL 1000-0.9 UT/500ML-% IV SOLN
INTRAVENOUS | Status: AC
  Filled 2020-01-31: qty 500

## 2020-01-31 MED ORDER — MIDAZOLAM HCL 5 MG/5ML IJ SOLN
INTRAMUSCULAR | Status: DC | PRN
  Administered 2020-01-31 (×2): 1 mg via INTRAVENOUS

## 2020-01-31 MED ORDER — ONDANSETRON HCL 4 MG/2ML IJ SOLN
INTRAMUSCULAR | Status: DC | PRN
  Administered 2020-01-31: 4 mg via INTRAVENOUS

## 2020-01-31 MED ORDER — SODIUM CHLORIDE 0.9% FLUSH: 3.0000 mL | INTRAVENOUS | Status: DC | PRN

## 2020-01-31 MED ORDER — ACETAMINOPHEN 500 MG PO TABS
1000.0000 mg | ORAL_TABLET | Freq: Once | ORAL | Status: AC
  Administered 2020-01-31: 09:00:00 1000 mg via ORAL
  Filled 2020-01-31 (×2): qty 2

## 2020-01-31 MED ORDER — FENTANYL CITRATE (PF) 100 MCG/2ML IJ SOLN
INTRAMUSCULAR | Status: DC | PRN
Start: 2020-01-31 — End: 2020-01-31
  Administered 2020-01-31: 50 ug via INTRAVENOUS
  Administered 2020-01-31: 25 ug via INTRAVENOUS
  Administered 2020-01-31 (×2): 50 ug via INTRAVENOUS

## 2020-01-31 MED ORDER — BUPIVACAINE HCL (PF) 0.25 % IJ SOLN
INTRAMUSCULAR | Status: DC | PRN
  Administered 2020-01-31: 30 mL

## 2020-01-31 MED ORDER — HEPARIN SODIUM (PORCINE) 1000 UNIT/ML IJ SOLN
INTRAMUSCULAR | Status: AC
  Filled 2020-01-31: qty 1

## 2020-01-31 MED ORDER — HEPARIN (PORCINE) IN NACL 2-0.9 UNITS/ML
INTRAMUSCULAR | Status: AC | PRN
  Administered 2020-01-31 (×3): 500 mL

## 2020-01-31 MED ORDER — PHENYLEPHRINE 40 MCG/ML (10ML) SYRINGE FOR IV PUSH (FOR BLOOD PRESSURE SUPPORT)
PREFILLED_SYRINGE | INTRAVENOUS | Status: DC | PRN
  Administered 2020-01-31: 80 ug via INTRAVENOUS

## 2020-01-31 MED ORDER — SODIUM CHLORIDE 0.9% FLUSH: 3.0000 mL | Freq: Two times a day (BID) | INTRAVENOUS | Status: DC

## 2020-01-31 MED ORDER — ONDANSETRON HCL 4 MG/2ML IJ SOLN: 4.0000 mg | Freq: Four times a day (QID) | INTRAMUSCULAR | Status: DC | PRN

## 2020-01-31 MED ORDER — SODIUM CHLORIDE 0.9 % IV SOLN: INTRAVENOUS | Status: DC

## 2020-01-31 MED ORDER — AMLODIPINE BESYLATE 5 MG PO TABS
5.0000 mg | ORAL_TABLET | Freq: Once | ORAL | Status: AC
  Administered 2020-01-31: 14:00:00 5 mg via ORAL
  Filled 2020-01-31: qty 1

## 2020-01-31 MED ORDER — HEPARIN SODIUM (PORCINE) 1000 UNIT/ML IJ SOLN
INTRAMUSCULAR | Status: DC | PRN
  Administered 2020-01-31: 1000 [IU] via INTRAVENOUS

## 2020-01-31 MED ORDER — ACETAMINOPHEN 325 MG PO TABS
650.0000 mg | ORAL_TABLET | ORAL | Status: DC | PRN
  Filled 2020-01-31: qty 2

## 2020-01-31 MED ORDER — PROPOFOL 500 MG/50ML IV EMUL
INTRAVENOUS | Status: DC | PRN
  Administered 2020-01-31: 25 ug/kg/min via INTRAVENOUS
  Administered 2020-01-31: 70 ug/kg/min via INTRAVENOUS

## 2020-01-31 MED ORDER — BUPIVACAINE HCL (PF) 0.25 % IJ SOLN
INTRAMUSCULAR | Status: AC
  Filled 2020-01-31: qty 30

## 2020-01-31 MED ORDER — SODIUM CHLORIDE 0.9 % IV SOLN: 250.0000 mL | INTRAVENOUS | Status: DC | PRN

## 2020-01-31 MED ORDER — PROPOFOL 10 MG/ML IV BOLUS
INTRAVENOUS | Status: DC | PRN
  Administered 2020-01-31: 10 mg via INTRAVENOUS
  Administered 2020-01-31: 15 mg via INTRAVENOUS
  Administered 2020-01-31: 20 mg via INTRAVENOUS
  Administered 2020-01-31: 15 mg via INTRAVENOUS

## 2020-01-31 SURGICAL SUPPLY — 18 items
BAG SNAP BAND KOVER 36X36 (MISCELLANEOUS) ×3 IMPLANT
CATH 8FR REPROCESSED SOUNDSTAR (CATHETERS) ×3 IMPLANT
CATH JOSEPH QUAD ALLRED 6F REP (CATHETERS) ×3 IMPLANT
CATH SMTCH THERMOCOOL SF DF (CATHETERS) ×3 IMPLANT
CLOSURE PERCLOSE PROSTYLE (VASCULAR PRODUCTS) ×12 IMPLANT
COVER DOME SNAP 22 D (MISCELLANEOUS) ×3 IMPLANT
COVER SWIFTLINK CONNECTOR (BAG) ×3 IMPLANT
MAT PREVALON FULL STRYKER (MISCELLANEOUS) ×3 IMPLANT
PACK EP LATEX FREE (CUSTOM PROCEDURE TRAY) ×3
PACK EP LF (CUSTOM PROCEDURE TRAY) ×1 IMPLANT
PAD PRO RADIOLUCENT 2001M-C (PAD) ×3 IMPLANT
PATCH CARTO3 (PAD) ×3 IMPLANT
SHEATH CARTO VIZIGO LRG CVD (SHEATH) ×3 IMPLANT
SHEATH PINNACLE 6F 10CM (SHEATH) ×3 IMPLANT
SHEATH PINNACLE 8F 10CM (SHEATH) ×3 IMPLANT
SHEATH PINNACLE 9F 10CM (SHEATH) ×3 IMPLANT
SHEATH PROBE COVER 6X72 (BAG) ×6 IMPLANT
TUBING SMART ABLATE COOLFLOW (TUBING) ×3 IMPLANT

## 2020-01-31 NOTE — Progress Notes (Signed)
BP elevated, Spoke with Oda Kilts PA /cards, orders followed

## 2020-01-31 NOTE — Anesthesia Procedure Notes (Signed)
Procedure Name: MAC Date/Time: 01/31/2020 10:45 AM Performed by: Moshe Salisbury, CRNA Pre-anesthesia Checklist: Patient identified, Emergency Drugs available, Suction available and Patient being monitored Patient Re-evaluated:Patient Re-evaluated prior to induction Oxygen Delivery Method: Nasal cannula Placement Confirmation: positive ETCO2 Dental Injury: Teeth and Oropharynx as per pre-operative assessment

## 2020-01-31 NOTE — Transfer of Care (Signed)
Immediate Anesthesia Transfer of Care Note  Patient: Richard Sexton  Procedure(s) Performed: PVC ABLATION (N/A )  Patient Location: Cath Lab  Anesthesia Type:MAC  Level of Consciousness: awake and patient cooperative  Airway & Oxygen Therapy: Patient Spontanous Breathing and Patient connected to nasal cannula oxygen  Post-op Assessment: Report given to RN and Post -op Vital signs reviewed and stable  Post vital signs: Reviewed and stable  Last Vitals:  Vitals Value Taken Time  BP 135/97 01/31/20 1239  Temp    Pulse 68 01/31/20 1240  Resp 16 01/31/20 1240  SpO2 100 % 01/31/20 1240  Vitals shown include unvalidated device data.  Last Pain:  Vitals:   01/31/20 0900  TempSrc: Oral         Complications: No complications documented.

## 2020-01-31 NOTE — Discharge Instructions (Signed)

## 2020-01-31 NOTE — H&P (Signed)
Electrophysiology Office Follow up Visit Note:    Date:  11/28/2019   ID:  Richard Sexton, DOB 23-Dec-1965, MRN [id number]  PCP:  Samuel Bouche, NP            [facility] Cardiologist:  No primary care provider on file.  [facility] Electrophysiologist:  Vickie Epley, MD    Interval History:    Richard Sexton is a 54 y.o. male who presents for a follow up visit. They were last seen in clinic 10/29/2019. 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  An echocardiogram and 7-day monitor were ordered.  Since his last appointment, he does intermittently feel palpitations with abnormal sensation in the upper chest.  No syncope or presyncope.         Past Medical History:  Diagnosis Date  . Allergy    OTC meds PRN   . Broken leg 1975   no surgery ,PT was casted - LT leg  . GERD (gastroesophageal reflux disease)    occ  . Hx of adenomatous polyp of colon 06/2019  . Hypertension   . Mass of head    right forehead         Past Surgical History:  Procedure Laterality Date  . CARPAL TUNNEL RELEASE Bilateral   . COLONOSCOPY W/ POLYPECTOMY  06/2019   diminutive adenoma recall 2029  . LEG SURGERY Right 1986   Femur shorten    Current Medications: Active Medications      Current Meds  Medication Sig  . amLODipine (NORVASC) 5 MG tablet Take 1 tablet (5 mg total) by mouth daily.  Marland Kitchen losartan (COZAAR) 25 MG tablet Take 1 tablet (25 mg total) by mouth daily.  Marland Kitchen terbinafine (LAMISIL) 250 MG tablet Take 1 tablet (250 mg total) by mouth daily.       Allergies:   Bactrim [sulfamethoxazole-trimethoprim]   Social History        Socioeconomic History  . Marital status: Single    Spouse name: Not on file  . Number of children: 0  . Years of education: Not on file  . Highest education level: High school graduate  Occupational History  . Occupation: cook  Tobacco Use  . Smoking status: Current Every Day Smoker     Packs/day: 0.75    Years: 8.00    Pack years: 6.00    Types: Cigarettes  . Smokeless tobacco: Never Used  Vaping Use  . Vaping Use: Never used  Substance and Sexual Activity  . Alcohol use: Yes    Comment: 1-2 drinks/week, beer or liquor  . Drug use: No  . Sexual activity: Yes    Partners: Female  Other Topics Concern  . Not on file  Social History Narrative  . Not on file   Social Determinants of Health      Financial Resource Strain:   . Difficulty of Paying Living Expenses: Not on file  Food Insecurity:   . Worried About Charity fundraiser in the Last Year: Not on file  . Ran Out of Food in the Last Year: Not on file  Transportation Needs:   . Lack of Transportation (Medical): Not on file  . Lack of Transportation (Non-Medical): Not on file  Physical Activity:   . Days of Exercise per Week: Not on file  . Minutes of Exercise per Session: Not on file  Stress:   . Feeling of Stress : Not on file  Social Connections:   . Frequency of  Communication with Friends and Family: Not on file  . Frequency of Social Gatherings with Friends and Family: Not on file  . Attends Religious Services: Not on file  . Active Member of Clubs or Organizations: Not on file  . Attends Archivist Meetings: Not on file  . Marital Status: Not on file     Family History: The patient's family history includes Diabetes in his mother; Hypertension in his mother. There is no history of Colon cancer, Colon polyps, Esophageal cancer, Rectal cancer, or Stomach cancer.  ROS:   Please see the history of present illness.    All other systems reviewed and are negative.  EKGs/Labs/Other Studies Reviewed:    The following studies were reviewed today: Echo and ZIO report  11/21/2019 ZIO report personally reviewed 18.1% PVC burden with one dominant morphology 1 run of NSVT lasting 4 beats at a rate of 197 bpm Rare supraventricular ectopy Heart rate 59-151 bpm, average 92  bpm   November 21, 2019 echo personally reviewed Contrasted images show a low normal left ventricular function around 45 to 50% No significant valvular abnormalities    EKG:  The ekg ordered today demonstrates sinus rhythm with a PVC originating from the outflow tract.  Recent Labs: 03/29/2019: TSH 0.59 11/04/2019: ALT 30; BUN 15; Creat 1.04; Hemoglobin 15.2; Platelets 147; Potassium 4.8; Sodium 140  Recent Lipid Panel Labs (Brief)          Component Value Date/Time   CHOL 130 11/04/2019 0720   CHOL 154 11/17/2017 1031   TRIG 89 11/04/2019 0720   HDL 45 11/04/2019 0720   HDL 46 11/17/2017 1031   CHOLHDL 2.9 11/04/2019 0720   VLDL 17 04/28/2009 2153   LDLCALC 68 11/04/2019 0720      Physical Exam:    VS:  BP 124/86   Pulse 88   Ht 5\' 7"  (1.702 m)   Wt 247 lb (112 kg)   SpO2 96%   BMI 38.69 kg/m        Wt Readings from Last 3 Encounters:  11/28/19 247 lb (112 kg)  10/29/19 240 lb (108.9 kg)  10/25/19 241 lb (109.3 kg)     GEN:  Well nourished, well developed in no acute distress HEENT: Normal NECK: No JVD; No carotid bruits LYMPHATICS: No lymphadenopathy CARDIAC: Regular rhythm with ectopy noted during auscultation, no murmurs, rubs, gallops RESPIRATORY:  Clear to auscultation without rales, wheezing or rhonchi  ABDOMEN: Soft, non-tender, non-distended MUSCULOSKELETAL:  No edema; No deformity  SKIN: Warm and dry NEUROLOGIC:  Alert and oriented x 3 PSYCHIATRIC:  Normal affect   ASSESSMENT:    1. PVC (premature ventricular contraction)   2. Alcohol use    PLAN:    In order of problems listed above:  1. 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  Discussed options for management including medications for PVC suppression and ablation.  Patient would like to pursue ablation.  Discussed the risks of the ablation procedure and he would like to proceed. Given the frequent PVCs and  mildly decreased left ventricular function will also order a cardiac MRI to better assess LV function and to look for possible causes for his frequent PVCs.  Risk, benefits, and alternatives to EP study and radiofrequency ablation for PVC/VT were also discussed in detail today. These risks include but are not limited to complete heart block, stroke, bleeding, vascular damage, tamponade, perforation, and death. The patient understands these risk and wishes  to proceed.  We will therefore proceed with catheter ablation at the next available time.  Carto and ICE are requested for the procedure.  Will also obtain a cardiac MRI prior to the procedure.     I have seen, examined the patient, and reviewed the above assessment and plan.    Plan for EPS and PVC ablation.  Procedure discussed including risks/benefits and he wishes to proceed.  Vickie Epley, MD 01/31/2020 10:10 AM

## 2020-01-31 NOTE — Anesthesia Preprocedure Evaluation (Addendum)
Anesthesia Evaluation  Patient identified by MRN, date of birth, ID band Patient awake    Reviewed: Allergy & Precautions, NPO status , Patient's Chart, lab work & pertinent test results  Airway Mallampati: II  TM Distance: >3 FB Neck ROM: Full    Dental no notable dental hx. (+) Teeth Intact, Dental Advisory Given   Pulmonary neg pulmonary ROS, Current Smoker and Patient abstained from smoking.,    Pulmonary exam normal breath sounds clear to auscultation       Cardiovascular hypertension, Pt. on medications Normal cardiovascular exam+ dysrhythmias (PVCs)  Rhythm:Regular Rate:Normal  TTE 2021 1. Technically difficult exam secondary to frequent PVCs. EF during non  PVC beats appears low normal (images 22 and 26 used for biplane  assessment). Likely no infero septal, inferior, anterolateral or anterior  WMAs, but sensitivity greatly reduced in  the setting of PVC frequency.. Left ventricular ejection fraction, by  estimation, is 50 to 55%. The left ventricle has low normal function. Left  ventricular endocardial border not optimally defined to evaluate regional  wall motion.   TTE 2021 1. Left ventricular ejection fraction, by estimation, is 40%. Challenging  assessment of LV function due to suboptimal acoustic windows for imaging,  no contrast administered. The left ventricle has mild to moderately  decreased function. Left ventricular  endocardial border not optimally defined to evaluate regional wall motion.  The left ventricular internal cavity size was mildly dilated.  Indeterminate diastolic filling due to E-A fusion.  2. Right ventricular systolic function is normal. The right ventricular  size is normal.  3. Left atrial size was mild to moderately dilated.  4. Right atrial size was mildly dilated.  5. The mitral valve is normal in structure. Trivial mitral valve  regurgitation. No evidence of mitral stenosis.   6. The aortic valve is normal in structure. Aortic valve regurgitation is  not visualized. No aortic stenosis is present.  7. Aortic dilatation noted. There is mild dilatation of the ascending  aorta, measuring 41 mm.  8. The inferior vena cava is normal in size with greater than 50%  respiratory variability, suggesting right atrial pressure of 3 mmHg   Neuro/Psych negative neurological ROS  negative psych ROS   GI/Hepatic Neg liver ROS, GERD  Controlled,  Endo/Other  negative endocrine ROS  Renal/GU negative Renal ROS  negative genitourinary   Musculoskeletal negative musculoskeletal ROS (+)   Abdominal   Peds  Hematology negative hematology ROS (+)   Anesthesia Other Findings   Reproductive/Obstetrics                            Anesthesia Physical Anesthesia Plan  ASA: II  Anesthesia Plan: MAC   Post-op Pain Management:    Induction: Intravenous  PONV Risk Score and Plan: 0 and Midazolam, Dexamethasone and Ondansetron  Airway Management Planned: Natural Airway and Simple Face Mask  Additional Equipment:   Intra-op Plan:   Post-operative Plan:   Informed Consent: I have reviewed the patients History and Physical, chart, labs and discussed the procedure including the risks, benefits and alternatives for the proposed anesthesia with the patient or authorized representative who has indicated his/her understanding and acceptance.     Dental advisory given  Plan Discussed with: CRNA  Anesthesia Plan Comments:        Anesthesia Quick Evaluation

## 2020-02-03 ENCOUNTER — Encounter (HOSPITAL_COMMUNITY): Payer: Self-pay | Admitting: Cardiology

## 2020-02-03 NOTE — Anesthesia Postprocedure Evaluation (Addendum)
Anesthesia Post Note  Patient: Rishikesh Khachatryan  Procedure(s) Performed: PVC ABLATION (N/A )     Patient location during evaluation: PACU Anesthesia Type: MAC Level of consciousness: awake and alert Pain management: pain level controlled Vital Signs Assessment: post-procedure vital signs reviewed and stable Respiratory status: spontaneous breathing, nonlabored ventilation, respiratory function stable and patient connected to nasal cannula oxygen Cardiovascular status: blood pressure returned to baseline and stable Postop Assessment: no apparent nausea or vomiting Anesthetic complications: no   No complications documented.  Last Vitals:  Vitals:   01/31/20 1535 01/31/20 1605  BP: 138/89 (!) 141/96  Pulse: 79 72  Resp: 13 12  Temp:    SpO2: 100% 98%    Last Pain:  Vitals:   01/31/20 1323  TempSrc:   PainSc: 0-No pain                 Maui Britten L Adele Milson

## 2020-02-04 ENCOUNTER — Telehealth: Payer: Self-pay | Admitting: Cardiology

## 2020-02-04 ENCOUNTER — Telehealth: Payer: Self-pay | Admitting: Medical-Surgical

## 2020-02-04 ENCOUNTER — Ambulatory Visit (INDEPENDENT_AMBULATORY_CARE_PROVIDER_SITE_OTHER): Payer: 59

## 2020-02-04 ENCOUNTER — Encounter: Payer: Self-pay | Admitting: Family Medicine

## 2020-02-04 ENCOUNTER — Ambulatory Visit (INDEPENDENT_AMBULATORY_CARE_PROVIDER_SITE_OTHER): Payer: 59 | Admitting: Family Medicine

## 2020-02-04 ENCOUNTER — Other Ambulatory Visit: Payer: Self-pay

## 2020-02-04 VITALS — BP 138/96 | HR 96 | Wt 247.0 lb

## 2020-02-04 DIAGNOSIS — M25562 Pain in left knee: Secondary | ICD-10-CM | POA: Diagnosis not present

## 2020-02-04 DIAGNOSIS — M25462 Effusion, left knee: Secondary | ICD-10-CM | POA: Insufficient documentation

## 2020-02-04 DIAGNOSIS — M25461 Effusion, right knee: Secondary | ICD-10-CM | POA: Insufficient documentation

## 2020-02-04 DIAGNOSIS — R2242 Localized swelling, mass and lump, left lower limb: Secondary | ICD-10-CM

## 2020-02-04 DIAGNOSIS — M1712 Unilateral primary osteoarthritis, left knee: Secondary | ICD-10-CM | POA: Insufficient documentation

## 2020-02-04 MED ORDER — MELOXICAM 15 MG PO TABS: 15.0000 mg | ORAL_TABLET | Freq: Every day | ORAL | 1 refills | Status: DC

## 2020-02-04 MED ORDER — KETOROLAC TROMETHAMINE 60 MG/2ML IM SOLN
60.0000 mg | Freq: Once | INTRAMUSCULAR | Status: AC
Start: 2020-02-04 — End: 2020-02-04
  Administered 2020-02-04: 16:00:00 60 mg via INTRAMUSCULAR

## 2020-02-04 NOTE — Telephone Encounter (Signed)
FYI: April with Access Nurse called.  Richard Sexton' left knee is swollen, painful and warm to the touch. Nurse recommends that patient be seen within the next 4 hrs. We have appointments available today with Dr. Zigmund Daniel. Nurse brought patient in on the call to see what time he would like to come in. Patient did not want to come in today and also wanted to make a call to someone then call us back.  Nurse insisted that he be seen today to prevent any further issues occurring like his heart speeding up again. Patient will call us back.

## 2020-02-04 NOTE — Assessment & Plan Note (Signed)
Flare of osteoarthritis versus gout. X-rays ordered of left knee. Given injection of Toradol and will go ahead and start meloxicam daily as needed. Discussed that if symptoms remain refractory to this treatment will have him follow-up with either me or Dr. Dianah Field to discuss injection.

## 2020-02-04 NOTE — Telephone Encounter (Signed)
Patient is on the schedule for this afternoon with Dr. Zigmund Daniel.

## 2020-02-04 NOTE — Telephone Encounter (Signed)
Returned call to Pt.  Advised ok to remove bandage and Pt may shower.  Pt indicates understanding.

## 2020-02-04 NOTE — Telephone Encounter (Signed)
New Message  Pt is calling and is wondering when he can remove the bandages after having his procedure  Please call

## 2020-02-04 NOTE — Progress Notes (Signed)
Richard Sexton - 54 y.o. male MRN [id number]  Date of birth: 20-Apr-1965  Subjective Chief Complaint  Patient presents with  . Joint Swelling    HPI Richard Sexton is a 54 year old male with a history of hypertension and palpitations.  Recently had ablation for PVCs.  He is doing well from this.  Today he has complaints of left-sided knee pain.  He states he has been on his feet more over the past couple weeks.  Works on [employer].  Left knee is warm, swollen with pain when standing.  He is able to ambulate fairly normally.  He denies any fever, chills or redness.  No prior history of gout.  ROS:  A comprehensive ROS was completed and negative except as noted per HPI  Allergies  Allergen Reactions  . Bactrim [Sulfamethoxazole-Trimethoprim] Palpitations    Past Medical History:  Diagnosis Date  . Allergy    OTC meds PRN   . Broken leg 1975   no surgery ,PT was casted - LT leg  . GERD (gastroesophageal reflux disease)    occ  . Hx of adenomatous polyp of colon 06/2019  . Hypertension   . Mass of head    right forehead    Past Surgical History:  Procedure Laterality Date  . CARPAL TUNNEL RELEASE Bilateral   . COLONOSCOPY W/ POLYPECTOMY  06/2019   diminutive adenoma recall 2029  . LEG SURGERY Right 1986   Femur shorten  . PVC ABLATION N/A 01/31/2020   Procedure: PVC ABLATION;  Surgeon: Vickie Epley, MD;  Location: [place];  Service: Cardiovascular;  Laterality: N/A;    Social History   Socioeconomic History  . Marital status: Single    Spouse name: Not on file  . Number of children: 0  . Years of education: Not on file  . Highest education level: High school graduate  Occupational History  . Occupation: cook  Tobacco Use  . Smoking status: Current Every Day Smoker    Packs/day: 0.75    Years: 8.00    Pack years: 6.00    Types: Cigarettes  . Smokeless tobacco: Never Used  Vaping Use  . Vaping Use: Never used  Substance and Sexual  Activity  . Alcohol use: Yes    Comment: 1-2 drinks/week, beer or liquor  . Drug use: No  . Sexual activity: Yes    Partners: Female  Other Topics Concern  . Not on file  Social History Narrative  . Not on file   Social Determinants of Health   Financial Resource Strain: Not on file  Food Insecurity: Not on file  Transportation Needs: Not on file  Physical Activity: Not on file  Stress: Not on file  Social Connections: Not on file    Family History  Problem Relation Age of Onset  . Diabetes Mother   . Hypertension Mother   . Colon cancer Neg Hx   . Colon polyps Neg Hx   . Esophageal cancer Neg Hx   . Rectal cancer Neg Hx   . Stomach cancer Neg Hx     Health Maintenance  Topic Date Due  . Hepatitis C Screening  Never done  . HIV Screening  Never done  . COVID-19 Vaccine (2 - Booster for [employer] series) 07/17/2019  . INFLUENZA VACCINE  05/14/2020 (Originally 09/15/2019)  . TETANUS/TDAP  06/02/2023  . COLONOSCOPY  07/04/2029     ----------------------------------------------------------------------------------------------------------------------------------------------------------------------------------------------------------------- Physical Exam BP (!) 138/96 (BP Location: Left Arm, Patient Position: Sitting, Cuff Size:  Large)   Pulse 96   Wt 247 lb (112 kg)   SpO2 97%   BMI 38.69 kg/m   Physical Exam Constitutional:      Appearance: Normal appearance.  Eyes:     General: No scleral icterus. Cardiovascular:     Rate and Rhythm: Normal rate and regular rhythm.  Musculoskeletal:     Cervical back: Neck supple.     Comments: Left knee with effusion.  ROM is fairly good.  Joint is warm without erythema.  TTP along medial joint line. No pain with patellar compression.   Negative meniscal provocation testing.  Ligaments without significant laxity.    Skin:    General: Skin is warm and dry.  Neurological:     General: No focal deficit present.     Mental  Status: He is alert.  Psychiatric:        Mood and Affect: Mood normal.     ------------------------------------------------------------------------------------------------------------------------------------------------------------------------------------------------------------------- Assessment and Plan  Acute pain of left knee Flare of osteoarthritis versus gout. X-rays ordered of left knee. Given injection of Toradol and will go ahead and start meloxicam daily as needed. Discussed that if symptoms remain refractory to this treatment will have him follow-up with either me or Dr. Dianah Field to discuss injection.   Meds ordered this encounter  Medications  . meloxicam (MOBIC) 15 MG tablet    Sig: Take 1 tablet (15 mg total) by mouth daily.    Dispense:  30 tablet    Refill:  1  . ketorolac (TORADOL) injection 60 mg    No follow-ups on file.    This visit occurred during the SARS-CoV-2 public health emergency.  Safety protocols were in place, including screening questions prior to the visit, additional usage of staff PPE, and extensive cleaning of exam room while observing appropriate contact time as indicated for disinfecting solutions.

## 2020-02-04 NOTE — Patient Instructions (Signed)
Try meloxicam daily as needed for pain/swelling.  Ice the knee 2-3 times each day.  We'll be in touch with xray results.

## 2020-02-10 ENCOUNTER — Ambulatory Visit (INDEPENDENT_AMBULATORY_CARE_PROVIDER_SITE_OTHER): Payer: 59 | Admitting: Medical-Surgical

## 2020-02-10 ENCOUNTER — Encounter: Payer: Self-pay | Admitting: Medical-Surgical

## 2020-02-10 ENCOUNTER — Ambulatory Visit: Payer: 59 | Admitting: Physician Assistant

## 2020-02-10 VITALS — BP 130/84 | HR 96 | Temp 99.5°F | Ht 67.0 in | Wt 248.5 lb

## 2020-02-10 DIAGNOSIS — M2392 Unspecified internal derangement of left knee: Secondary | ICD-10-CM | POA: Diagnosis not present

## 2020-02-10 DIAGNOSIS — M25562 Pain in left knee: Secondary | ICD-10-CM

## 2020-02-10 MED ORDER — NAPROXEN 500 MG PO TABS: 500.0000 mg | ORAL_TABLET | Freq: Two times a day (BID) | ORAL | 0 refills | Status: DC

## 2020-02-10 NOTE — Progress Notes (Signed)
Subjective:    CC: Continued left knee pain  HPI: Pleasant 54 year old male presenting today for evaluation of continued left knee pain.  He was seen on 12/21 by Dr. Ashley Royalty who prescribed meloxicam for suspected osteoarthritis flare.  Patient notes he has been taking meloxicam 15 mg daily and has experienced no relief in his knee pain.  Notes that 2 days ago, he was leaning over to put a can in the drink holder of the car and he felt a snapping sensation with a popping sound.  For the rest of that day, he was not able to put any pressure on the knee/leg due to pain.  Now he is able to walk, but he is certainly favoring it.  Feels a grinding in the lateral lower aspect of the patella when walking.  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  Notes that he previously took a medicine years ago that was 500 mg tablets which helped tremendously.  He would like to retry this if at all possible.  He does have a brace at home that he just started wearing today.  Notes that he does still have some pain but the knee brace does provide some support.  I reviewed the past medical history, family history, social history, surgical history, and allergies today and no changes were needed.  Please see the problem list section below in epic for further details.  Past Medical History: Past Medical History:  Diagnosis Date  . Allergy    OTC meds PRN   . Broken leg 1975   no surgery ,PT was casted - LT leg  . GERD (gastroesophageal reflux disease)    occ  . Hx of adenomatous polyp of colon 06/2019  . Hypertension   . Mass of head    right forehead   Past Surgical History: Past Surgical History:  Procedure Laterality Date  . CARPAL TUNNEL RELEASE Bilateral   . COLONOSCOPY W/ POLYPECTOMY  06/2019   diminutive adenoma recall 2029  . LEG SURGERY Right 1986   Femur shorten  . PVC ABLATION N/A 01/31/2020   Procedure: PVC ABLATION;   Surgeon: Lanier Prude, MD;  Location: [facility];  Service: Cardiovascular;  Laterality: N/A;   Social History: Social History   Socioeconomic History  . Marital status: Single    Spouse name: Not on file  . Number of children: 0  . Years of education: Not on file  . Highest education level: High school graduate  Occupational History  . Occupation: cook  Tobacco Use  . Smoking status: Current Every Day Smoker    Packs/day: 0.75    Years: 8.00    Pack years: 6.00    Types: Cigarettes  . Smokeless tobacco: Never Used  Vaping Use  . Vaping Use: Never used  Substance and Sexual Activity  . Alcohol use: Yes    Comment: 1-2 drinks/week, beer or liquor  . Drug use: No  . Sexual activity: Yes    Partners: Female  Other Topics Concern  . Not on file  Social History Narrative  . Not on file   Social Determinants of Health   Financial Resource Strain: Not on file  Food Insecurity: Not on file  Transportation Needs: Not on file  Physical Activity: Not on file  Stress: Not on file  Social Connections: Not on file   Family History: Family History  Problem Relation Age  of Onset  . Diabetes Mother   . Hypertension Mother   . Colon cancer Neg Hx   . Colon polyps Neg Hx   . Esophageal cancer Neg Hx   . Rectal cancer Neg Hx   . Stomach cancer Neg Hx    Allergies: Allergies  Allergen Reactions  . Bactrim [Sulfamethoxazole-Trimethoprim] Palpitations   Medications: See med rec.  Review of Systems: See HPI for pertinent positives and negatives.   Objective:    General: Well Developed, well nourished, and in no acute distress.  Neuro: Alert and oriented x3. HEENT: Normocephalic.  Skin: Warm and dry. Cardiac: Regular rate and rhythm, no murmurs rubs or gallops, no lower extremity edema.  Respiratory: Clear to auscultation bilaterally. Not using accessory muscles, speaking in full sentences. Right knee: Full range of motion both passively and actively but  pain noted with full flexion of the knee and some with full extension.  No pain or tenderness on manipulation of the patella.  Tenderness just below the patella both laterally and medially.  Popping and catching with flexion and extension of the knee.  Mild discomfort with valgus pressure.   Impression and Recommendations:    1. Acute pain of left knee/locking left knee X-ray images from last week were negative for any acute dislocation or fracture.  Mild osteoarthritis noted.  Concern for possible soft tissue injury especially in the setting of popping and catching with range of motion.  MRI of the left knee without contrast ordered.  Advised patient to wear his knee brace when spending long hours on his feet.  Work note provided to allow 10-minute work break at least every hour and to avoid lifting greater than 15 pounds.  Discontinue meloxicam and start naproxen 500 mg twice daily. - MR Knee Left  Wo Contrast; Future  Return if symptoms worsen or fail to improve.  Further follow-up pending MRI results. ___________________________________________ Thayer Ohm, DNP, APRN, FNP-BC Primary Care and Sports Medicine [facility]

## 2020-02-22 ENCOUNTER — Ambulatory Visit (INDEPENDENT_AMBULATORY_CARE_PROVIDER_SITE_OTHER): Payer: 59

## 2020-02-22 ENCOUNTER — Other Ambulatory Visit: Payer: Self-pay

## 2020-02-22 DIAGNOSIS — S83282A Other tear of lateral meniscus, current injury, left knee, initial encounter: Secondary | ICD-10-CM

## 2020-02-22 DIAGNOSIS — M7122 Synovial cyst of popliteal space [Baker], left knee: Secondary | ICD-10-CM | POA: Diagnosis not present

## 2020-02-22 DIAGNOSIS — M25462 Effusion, left knee: Secondary | ICD-10-CM

## 2020-02-22 DIAGNOSIS — M25562 Pain in left knee: Secondary | ICD-10-CM

## 2020-02-27 ENCOUNTER — Ambulatory Visit: Payer: 59 | Admitting: Cardiology

## 2020-03-03 ENCOUNTER — Encounter: Payer: 59 | Admitting: Sports Medicine

## 2020-03-04 ENCOUNTER — Encounter: Payer: Self-pay | Admitting: Cardiology

## 2020-03-04 ENCOUNTER — Ambulatory Visit (INDEPENDENT_AMBULATORY_CARE_PROVIDER_SITE_OTHER): Payer: 59

## 2020-03-04 ENCOUNTER — Other Ambulatory Visit: Payer: Self-pay

## 2020-03-04 ENCOUNTER — Ambulatory Visit (INDEPENDENT_AMBULATORY_CARE_PROVIDER_SITE_OTHER): Payer: 59 | Admitting: Cardiology

## 2020-03-04 ENCOUNTER — Ambulatory Visit (INDEPENDENT_AMBULATORY_CARE_PROVIDER_SITE_OTHER): Payer: 59 | Admitting: Sports Medicine

## 2020-03-04 VITALS — BP 138/80 | HR 96 | Ht 67.0 in | Wt 244.0 lb

## 2020-03-04 DIAGNOSIS — M1712 Unilateral primary osteoarthritis, left knee: Secondary | ICD-10-CM

## 2020-03-04 DIAGNOSIS — M25562 Pain in left knee: Secondary | ICD-10-CM | POA: Diagnosis not present

## 2020-03-04 DIAGNOSIS — I493 Ventricular premature depolarization: Secondary | ICD-10-CM

## 2020-03-04 NOTE — Assessment & Plan Note (Signed)
This is a pleasant 55 year old male with chronic knee pain, left-sided, medial joint line, known meniscal tear and osteoarthritis on x-rays. Not sufficient improvement with meloxicam, today we did an injection, I would like him to do some physical therapy, return to see me in a month.

## 2020-03-04 NOTE — Patient Instructions (Signed)
Medication Instructions:  Your physician recommends that you continue on your current medications as directed. Please refer to the Current Medication list given to you today.  Labwork: None ordered.  Testing/Procedures: None ordered.  Follow-Up: Your physician wants you to follow-up in: one year with Dr. Lambert.   You will receive a reminder letter in the mail two months in advance. If you don't receive a letter, please call our office to schedule the follow-up appointment.   Any Other Special Instructions Will Be Listed Below (If Applicable).  If you need a refill on your cardiac medications before your next appointment, please call your pharmacy.   

## 2020-03-04 NOTE — Progress Notes (Signed)
    Procedures performed today:    Procedure: Real-time Ultrasound Guided aspiration/injection of the left knee Device: Samsung HS60  Verbal informed consent obtained.  Time-out conducted.  Noted no overlying erythema, induration, or other signs of local infection.  Skin prepped in a sterile fashion.  Local anesthesia: Topical Ethyl chloride.  With sterile technique and under real time ultrasound guidance:  Using 18-gauge needle advanced to the suprapatellar recess, aspirated 33 cc of clear, straw-colored fluid, syringe switched and 1 cc Kenalog 40, 2 cc lidocaine, 2 cc bupivacaine injected easily Completed without difficulty  Advised to call if fevers/chills, erythema, induration, drainage, or persistent bleeding.  Images permanently stored and available for review in PACS.  Impression: Technically successful ultrasound guided injection.  Independent interpretation of notes and tests performed by another provider:   None.  Brief History, Exam, Impression, and Recommendations:    Primary osteoarthritis of left knee This is a pleasant 55 year old male with chronic knee pain, left-sided, medial joint line, known meniscal tear and osteoarthritis on x-rays. Not sufficient improvement with meloxicam, today we did an injection, I would like him to do some physical therapy, return to see me in a month.    ___________________________________________ Gwen Her. Dianah Field, M.D., ABFM., CAQSM. Primary Care and [place] Instructor of [place] [facility]

## 2020-03-04 NOTE — Progress Notes (Signed)
Electrophysiology Office Follow up Visit Note:    Date:  03/04/2020   ID:  Richard Sexton, DOB [birth year], MRN [id number]  PCP:  Samuel Bouche, NP  [facility] Cardiologist:  No primary care provider on file.  [facility] Electrophysiologist:  Vickie Epley, MD    Interval History:    Richard Sexton is a 56 y.o. male who presents for a follow up visit after PVC ablation on January 31, 2020.  During the procedure, his clinical PVC was localized to the right ventricular outflow tract and successfully ablated.  After ablation was completed, the patient was monitored for 20 minutes without recurrence of the PVC.  He has done well since the ablation procedure.     Past Medical History:  Diagnosis Date  . Allergy    OTC meds PRN   . Broken leg 1975   no surgery ,PT was casted - LT leg  . GERD (gastroesophageal reflux disease)    occ  . Hx of adenomatous polyp of colon 06/2019  . Hypertension   . Mass of head    right forehead    Past Surgical History:  Procedure Laterality Date  . CARPAL TUNNEL RELEASE Bilateral   . COLONOSCOPY W/ POLYPECTOMY  06/2019   diminutive adenoma recall 2029  . LEG SURGERY Right 1986   Femur shorten  . PVC ABLATION N/A 01/31/2020   Procedure: PVC ABLATION;  Surgeon: Vickie Epley, MD;  Location: [facility];  Service: Cardiovascular;  Laterality: N/A;    Current Medications: Current Meds  Medication Sig  . amLODipine (NORVASC) 5 MG tablet Take 1 tablet (5 mg total) by mouth daily.  Marland Kitchen losartan (COZAAR) 25 MG tablet Take 1 tablet by mouth once daily     Allergies:   Bactrim [sulfamethoxazole-trimethoprim]   Social History   Socioeconomic History  . Marital status: Single    Spouse name: Not on file  . Number of children: 0  . Years of education: Not on file  . Highest education level: High school graduate  Occupational History  . Occupation: cook  Tobacco Use  . Smoking status: Current Every Day Smoker    Packs/day:  0.75    Years: 8.00    Pack years: 6.00    Types: Cigarettes  . Smokeless tobacco: Never Used  Vaping Use  . Vaping Use: Never used  Substance and Sexual Activity  . Alcohol use: Yes    Comment: 1-2 drinks/week, beer or liquor  . Drug use: No  . Sexual activity: Yes    Partners: Female  Other Topics Concern  . Not on file  Social History Narrative  . Not on file   Social Determinants of Health   Financial Resource Strain: Not on file  Food Insecurity: Not on file  Transportation Needs: Not on file  Physical Activity: Not on file  Stress: Not on file  Social Connections: Not on file     Family History: The patient's family history includes Diabetes in his mother; Hypertension in his mother. There is no history of Colon cancer, Colon polyps, Esophageal cancer, Rectal cancer, or Stomach cancer.  ROS:   Please see the history of present illness.    All other systems reviewed and are negative.  EKGs/Labs/Other Studies Reviewed:    The following studies were reviewed today:   EKG:  The ekg ordered today demonstrates sinus rhythm  Recent Labs: 03/29/2019: TSH 0.59 11/04/2019: ALT 30 01/29/2020: BUN 11; Creatinine, Ser 0.95; Hemoglobin 14.8; Platelets 147; Potassium  4.1; Sodium 142  Recent Lipid Panel    Component Value Date/Time   CHOL 130 11/04/2019 0720   CHOL 154 11/17/2017 1031   TRIG 89 11/04/2019 0720   HDL 45 11/04/2019 0720   HDL 46 11/17/2017 1031   CHOLHDL 2.9 11/04/2019 0720   VLDL 17 04/28/2009 2153   LDLCALC 68 11/04/2019 0720    Physical Exam:    VS:  BP 138/80   Pulse 96   Ht 5\' 7"  (1.702 m)   Wt 244 lb (110.7 kg)   SpO2 96%   BMI 38.22 kg/m     Wt Readings from Last 3 Encounters:  03/04/20 244 lb (110.7 kg)  02/10/20 248 lb 8 oz (112.7 kg)  02/04/20 247 lb (112 kg)     GEN: Well nourished, well developed in no acute distress HEENT: Normal NECK: No JVD; No carotid bruits LYMPHATICS: No lymphadenopathy CARDIAC: RRR, no murmurs, rubs,  gallops RESPIRATORY:  Clear to auscultation without rales, wheezing or rhonchi  ABDOMEN: Soft, non-tender, non-distended MUSCULOSKELETAL:  No edema; No deformity  SKIN: Warm and dry NEUROLOGIC:  Alert and oriented x 3 PSYCHIATRIC:  Normal affect   ASSESSMENT:    1. PVC (premature ventricular contraction)    PLAN:    In order of problems listed above:  1. Frequent PVCs Post PVC ablation January 31, 2020. No PVCs since procedure. Follow-up 1 year.  Okay to return to work.  Medication Adjustments/Labs and Tests Ordered: Current medicines are reviewed at length with the patient today.  Concerns regarding medicines are outlined above.  No orders of the defined types were placed in this encounter.  No orders of the defined types were placed in this encounter.    Signed, Lars Mage, MD, [facility]  03/04/2020 1:23 PM    Electrophysiology [facility]

## 2020-03-06 ENCOUNTER — Ambulatory Visit: Payer: 59 | Admitting: Medical-Surgical

## 2020-03-11 ENCOUNTER — Ambulatory Visit: Payer: 59 | Admitting: Medical-Surgical

## 2020-04-01 ENCOUNTER — Ambulatory Visit: Payer: 59 | Admitting: Sports Medicine

## 2020-04-17 ENCOUNTER — Ambulatory Visit (INDEPENDENT_AMBULATORY_CARE_PROVIDER_SITE_OTHER): Payer: 59 | Admitting: Medical-Surgical

## 2020-04-17 ENCOUNTER — Telehealth: Payer: Self-pay | Admitting: Sports Medicine

## 2020-04-17 ENCOUNTER — Ambulatory Visit (INDEPENDENT_AMBULATORY_CARE_PROVIDER_SITE_OTHER): Payer: 59

## 2020-04-17 ENCOUNTER — Other Ambulatory Visit: Payer: Self-pay

## 2020-04-17 ENCOUNTER — Ambulatory Visit (INDEPENDENT_AMBULATORY_CARE_PROVIDER_SITE_OTHER): Payer: 59 | Admitting: Sports Medicine

## 2020-04-17 ENCOUNTER — Encounter: Payer: Self-pay | Admitting: Medical-Surgical

## 2020-04-17 VITALS — BP 155/90 | HR 54 | Temp 99.5°F | Ht 67.0 in | Wt 246.9 lb

## 2020-04-17 DIAGNOSIS — R21 Rash and other nonspecific skin eruption: Secondary | ICD-10-CM

## 2020-04-17 DIAGNOSIS — M25462 Effusion, left knee: Secondary | ICD-10-CM | POA: Diagnosis not present

## 2020-04-17 DIAGNOSIS — I10 Essential (primary) hypertension: Secondary | ICD-10-CM

## 2020-04-17 DIAGNOSIS — M25461 Effusion, right knee: Secondary | ICD-10-CM

## 2020-04-17 MED ORDER — TRIAMCINOLONE ACETONIDE 0.1 % EX OINT: 1.0000 "application " | TOPICAL_OINTMENT | Freq: Two times a day (BID) | CUTANEOUS | 6 refills | Status: AC

## 2020-04-17 MED ORDER — AMLODIPINE BESYLATE 5 MG PO TABS: 5.0000 mg | ORAL_TABLET | Freq: Every day | ORAL | 1 refills | Status: DC

## 2020-04-17 MED ORDER — MELOXICAM 15 MG PO TABS: ORAL_TABLET | ORAL | 3 refills | Status: DC

## 2020-04-17 NOTE — Telephone Encounter (Signed)
Please work on [employer], x-ray confirmed osteoarthritis, bilateral, failed everything including injections.

## 2020-04-17 NOTE — Assessment & Plan Note (Addendum)
This pleasant 55 year old male has bilateral knee pain, at the last visit we aspirated and injected his left knee, unfortunately he continues to have swelling and pain. I am going to set him up for an arthroscopy, we already did an MRI that showed some meniscal maceration. He is having increasing pain in his right knee as well, aspiration and injection today, return to see me in a month. We will switch him from naproxen to meloxicam and also try to get him approved for bilateral Orthovisc.

## 2020-04-17 NOTE — Progress Notes (Signed)
    Procedures performed today:    Procedure: Real-time Ultrasound Guided aspiration/injection of right knee Device: Samsung HS60  Verbal informed consent obtained.  Time-out conducted.  Noted no overlying erythema, induration, or other signs of local infection.  Skin prepped in a sterile fashion.  Local anesthesia: Topical Ethyl chloride.  With sterile technique and under real time ultrasound guidance:  Aspirated approximately 33 mL of clear, straw-colored fluid, syringe switched and 1 cc Kenalog 40, 2 cc lidocaine, 2 cc bupivacaine injected easily Completed without difficulty  Advised to call if fevers/chills, erythema, induration, drainage, or persistent bleeding.  Images permanently stored and available for review in PACS.  Impression: Technically successful ultrasound guided injection.  Procedure: Real-time Ultrasound Guided aspiration of left knee Device: Samsung HS60  Verbal informed consent obtained.  Time-out conducted.  Noted no overlying erythema, induration, or other signs of local infection.  Skin prepped in a sterile fashion.  Local anesthesia: Topical Ethyl chloride.  With sterile technique and under real time ultrasound guidance:  Noted effusion, using an 18-gauge needle aspirated 42 mL of clear, straw-colored fluid.  No injection performed.  Completed without difficulty  Advised to call if fevers/chills, erythema, induration, drainage, or persistent bleeding.  Images permanently stored and available for review in PACS.  Impression: Technically successful ultrasound guided injection.  Independent interpretation of notes and tests performed by another provider:   None.  Brief History, Exam, Impression, and Recommendations:    Bilateral knee swelling This pleasant 55 year old male has bilateral knee pain, at the last visit we aspirated and injected his left knee, unfortunately he continues to have swelling and pain. I am going to set him up for an arthroscopy, we  already did an MRI that showed some meniscal maceration. He is having increasing pain in his right knee as well, aspiration and injection today, return to see me in a month. We will switch him from naproxen to meloxicam and also try to get him approved for bilateral Orthovisc.    ___________________________________________ Gwen Her. Dianah Field, M.D., ABFM., CAQSM. Primary Care and [place] Instructor of [place] of [facility] of Medicine

## 2020-04-17 NOTE — Progress Notes (Signed)
Subjective:    CC: HTN follow up  HPI: Pleasant 55 year old male presenting today for HTN follow up. Taking Amlodipine 5mg  daily along with Losartan 25mg  daily. Does not check blood pressures at home. Eating a low sodium diet and exercising regularly. Denies CP, SOB, palpitations, lower extremity edema, dizziness, headaches, or vision changes. Not due to follow up with cardiology for 1 year as he is s/p ablation.   Has a rash that is on his right anterior lower leg and his right lower abdomen. Unsure how long it has been there. It is itchy and he catches himself scratching it at times. Is using an outdoor anti-itch cream prn which helps with itching but does not resolve the rash. No change in cosmetics, detergents, cleaners, medications, foods, or materials. Walks to work but has not come in contact with any outdoor elements in the areas of concern. Uses public washer/dryer.   I reviewed the past medical history, family history, social history, surgical history, and allergies today and no changes were needed.  Please see the problem list section below in epic for further details.  Past Medical History: Past Medical History:  Diagnosis Date  . Allergy    OTC meds PRN   . Broken leg 1975   no surgery ,PT was casted - LT leg  . GERD (gastroesophageal reflux disease)    occ  . Hx of adenomatous polyp of colon 06/2019  . Hypertension   . Mass of head    right forehead   Past Surgical History: Past Surgical History:  Procedure Laterality Date  . CARPAL TUNNEL RELEASE Bilateral   . COLONOSCOPY W/ POLYPECTOMY  06/2019   diminutive adenoma recall 2029  . LEG SURGERY Right 1986   Femur shorten  . PVC ABLATION N/A 01/31/2020   Procedure: PVC ABLATION;  Surgeon: Vickie Epley, MD;  Location: [place] [facility];  Service: Cardiovascular;  Laterality: N/A;   Social History: Social History   Socioeconomic History  . Marital status: Single    Spouse name: Not on file  . Number of  children: 0  . Years of education: Not on file  . Highest education level: High school graduate  Occupational History  . Occupation: cook  Tobacco Use  . Smoking status: Current Every Day Smoker    Packs/day: 0.75    Years: 8.00    Pack years: 6.00    Types: Cigarettes  . Smokeless tobacco: Never Used  Vaping Use  . Vaping Use: Never used  Substance and Sexual Activity  . Alcohol use: Yes    Comment: 1-2 drinks/week, beer or liquor  . Drug use: No  . Sexual activity: Yes    Partners: Female  Other Topics Concern  . Not on file  Social History Narrative  . Not on file   Social Determinants of Health   Financial Resource Strain: Not on file  Food Insecurity: Not on file  Transportation Needs: Not on file  Physical Activity: Not on file  Stress: Not on file  Social Connections: Not on file   Family History: Family History  Problem Relation Age of Onset  . Diabetes Mother   . Hypertension Mother   . Colon cancer Neg Hx   . Colon polyps Neg Hx   . Esophageal cancer Neg Hx   . Rectal cancer Neg Hx   . Stomach cancer Neg Hx    Allergies: Allergies  Allergen Reactions  . Bactrim [Sulfamethoxazole-Trimethoprim] Palpitations   Medications: See med rec.  Review  of Systems: See HPI for pertinent positives and negatives.   Objective:    General: Well Developed, well nourished, and in no acute distress.  Neuro: Alert and oriented x3.  HEENT: Normocephalic, atraumatic. Skin: Warm and dry. Papular rash to the right medial anterior shin in an area approximately 10cm by 12cm, some excoriation noted from scratching. Area on abdomen not visualized.  Cardiac: Irregular rate and rhythm, no murmurs rubs or gallops, no lower extremity edema.  Respiratory: Clear to auscultation bilaterally. Not using accessory muscles, speaking in full sentences.  Impression and Recommendations:    1. Essential hypertension BP elevated at 155/90 today but patient just underwent knee  injections with Dr. Darene Lamer. Reading not accurate due to anxiety and pain with procedure. Advised to monitor BP at home a few times a week for the next two weeks. I will reach out via MyChart to determine his home readings and if he needs a dosage change on his medications. For now, continue Amlodipine 5mg  and Losartan 25mg  daily.  - amLODipine (NORVASC) 5 MG tablet; Take 1 tablet (5 mg total) by mouth daily.  Dispense: 90 tablet; Refill: 1  2. Rash Unclear etiology. Triamcinolone ointment BID to affected areas. If no improvement in 1 week, return for evaluation.   Return in about 6 months (around 10/18/2020) for HTN follow up. __________________________________________ Clearnce Sorrel, DNP, APRN, FNP-BC Primary Care and [place]

## 2020-04-30 NOTE — Telephone Encounter (Signed)
Started Orthovisc process and to see if PA is required. - CF

## 2020-05-01 ENCOUNTER — Encounter: Payer: Self-pay | Admitting: Medical-Surgical

## 2020-05-04 ENCOUNTER — Telehealth: Payer: Self-pay

## 2020-05-04 NOTE — Telephone Encounter (Signed)
LVMTRC (1st attempt)    Please see Joy's message below  

## 2020-05-04 NOTE — Telephone Encounter (Signed)
-----   Message from Samuel Bouche, NP sent at 05/04/2020  8:15 AM EDT ----- Can you please call Richard Sexton to see how his home blood pressures are doing? I sent him a Mychart message last week but it has not been read.   Thanks, Joy ----- Message ----- From: Samuel Bouche, NP Sent: 05/04/2020  12:00 AM EDT To: Samuel Bouche, NP  Check on MyChart message response from 05/01/2020   ----- Message ----- From: Samuel Bouche, NP Sent: 05/01/2020  12:00 AM EDT To: Samuel Bouche, NP  Check on BP

## 2020-05-05 NOTE — Telephone Encounter (Signed)
Patient aware of recommendations and verbalized understanding. No further questions or concerns at this time.

## 2020-05-05 NOTE — Telephone Encounter (Signed)
Pt states his readings have been better. He was at work and did not have his log in front of him bur remembers his last reading was around 130/80.

## 2020-05-05 NOTE — Telephone Encounter (Signed)
Ok! That sounds great. Continue the current regimen and we will plan to follow up as discussed in 6 months. Recommend keeping track of home BP a few times a week and if they become elevated above 130/80 for several readings will need to come in for re-evaluation of his treatment regimen.

## 2020-05-11 NOTE — Telephone Encounter (Signed)
I received BID and patient is responsible for cost of injection until deductible is met also a Pa is required. I am going to do Prior auth today. I spoke with patient he is aware he will pay 600.00 per visit and whatever the medication cost. - CF

## 2020-05-15 ENCOUNTER — Other Ambulatory Visit: Payer: Self-pay

## 2020-05-15 ENCOUNTER — Ambulatory Visit (INDEPENDENT_AMBULATORY_CARE_PROVIDER_SITE_OTHER): Payer: 59 | Admitting: Sports Medicine

## 2020-05-15 ENCOUNTER — Ambulatory Visit (INDEPENDENT_AMBULATORY_CARE_PROVIDER_SITE_OTHER): Payer: 59

## 2020-05-15 DIAGNOSIS — M25462 Effusion, left knee: Secondary | ICD-10-CM | POA: Diagnosis not present

## 2020-05-15 DIAGNOSIS — M25461 Effusion, right knee: Secondary | ICD-10-CM | POA: Diagnosis not present

## 2020-05-15 MED ORDER — IBUPROFEN 800 MG PO TABS: 800.0000 mg | ORAL_TABLET | Freq: Three times a day (TID) | ORAL | 2 refills | Status: DC | PRN

## 2020-05-15 MED ORDER — TRAMADOL HCL 50 MG PO TABS: 50.0000 mg | ORAL_TABLET | Freq: Three times a day (TID) | ORAL | 0 refills | Status: DC | PRN

## 2020-05-15 NOTE — Progress Notes (Signed)
    Procedures performed today:    Procedure: Real-time Ultrasound Guided aspiration of left knee Device: Samsung HS60  Verbal informed consent obtained.  Time-out conducted.  Noted no overlying erythema, induration, or other signs of local infection.  Skin prepped in a sterile fashion.  Local anesthesia: Topical Ethyl chloride.  With sterile technique and under real time ultrasound guidance:  Noted moderate effusion, using an 18-gauge needle aspirated approximately 39 mL of clear, straw-colored fluid.   Completed without difficulty  Advised to call if fevers/chills, erythema, induration, drainage, or persistent bleeding.  Images permanently stored and available for review in PACS.  Impression: Technically successful ultrasound guided injection.  Procedure: Real-time Ultrasound Guided aspiration of right knee Device: Samsung HS60  Verbal informed consent obtained.  Time-out conducted.  Noted no overlying erythema, induration, or other signs of local infection.  Skin prepped in a sterile fashion.  Local anesthesia: Topical Ethyl chloride.  With sterile technique and under real time ultrasound guidance:  Noted moderate effusion, using an 18-gauge needle aspirated approximately 52 mL of clear, straw-colored fluid.   Completed without difficulty  Advised to call if fevers/chills, erythema, induration, drainage, or persistent bleeding.  Images permanently stored and available for review in PACS.  Impression: Technically successful ultrasound guided injection.  Independent interpretation of notes and tests performed by another provider:   None.  Brief History, Exam, Impression, and Recommendations:    Bilateral knee swelling Richard Sexton is a pleasant 55 year old male, he has bilateral knee pain, he has had aspirations and injections, viscosupplementation is too expensive. He had an MRI that showed some meniscal maceration. Repeat aspiration today without injection per patient request,  naproxen and meloxicam have been inefficacious so we will switch to ibuprofen 800 mg 3 times a day with tramadol for breakthrough pain. At this point we do need to get him in with Dr. Berenice Primas, considering only minimal osteoarthritis and his meniscal maceration I think an arthroscopy may be sufficient for him rather than an arthroplasty.    ___________________________________________ Gwen Her. Dianah Field, M.D., ABFM., CAQSM. Primary Care and [place] Instructor of [place] of [facility] Medicine

## 2020-05-15 NOTE — Assessment & Plan Note (Signed)
Richard Sexton is a pleasant 55 year old male, he has bilateral knee pain, he has had aspirations and injections, viscosupplementation is too expensive. He had an MRI that showed some meniscal maceration. Repeat aspiration today without injection per patient request, naproxen and meloxicam have been inefficacious so we will switch to ibuprofen 800 mg 3 times a day with tramadol for breakthrough pain. At this point we do need to get him in with Dr. Berenice Primas, considering only minimal osteoarthritis and his meniscal maceration I think an arthroscopy may be sufficient for him rather than an arthroplasty.

## 2020-06-11 ENCOUNTER — Other Ambulatory Visit: Payer: Self-pay | Admitting: Orthopedic Surgery

## 2020-06-11 ENCOUNTER — Telehealth: Payer: Self-pay | Admitting: *Deleted

## 2020-06-11 NOTE — Telephone Encounter (Signed)
   [place] [facility] Pre-operative Risk Assessment    Patient Name: Richard Sexton  DOB: 1965-03-07  MRN: [id number]   HEARTCARE STAFF: - Please ensure there is not already an duplicate clearance open for this procedure. - Under Visit Info/Reason for Call, type in Other and utilize the format Clearance MM/DD/YY or Clearance TBD. Do not use dashes or single digits. - If request is for dental extraction, please clarify the # of teeth to be extracted.  Request for surgical clearance:  1. What type of surgery is being performed? B/L KNEE SCOPE   2. When is this surgery scheduled? 06/29/20   3. What type of clearance is required (medical clearance vs. Pharmacy clearance to hold med vs. Both)? MEDICAL  4. Are there any medications that need to be held prior to surgery and how long? NONE LISTED   5. Practice name and name of physician performing surgery? [facility]; DR. Jenny Reichmann [place]   6. What is the office phone number? [phone]   7.   What is the office fax number? [phone] ATTN: JUDY DANIELS  8.   Anesthesia type (None, local, MAC, general) ? CHOICE   Julaine Hua 06/11/2020, 4:20 PM  _________________________________________________________________   (provider comments below)

## 2020-06-12 NOTE — Telephone Encounter (Signed)
   Primary Cardiologist: Vickie Epley, MD  Chart reviewed as part of pre-operative protocol coverage. Given past medical history and time since last visit, based on ACC/AHA guidelines, Richard Sexton would be at acceptable risk for the planned procedure without further cardiovascular testing.   His RCRI is a class I risk, 0.4% risk of major cardiac event.  He is able to complete greater than 4 METS of physical activity.  Patient was advised that if he develops new symptoms prior to surgery to contact our office to arrange a follow-up appointment.  He verbalized understanding.  I will route this recommendation to the requesting party via Epic fax function and remove from pre-op pool.  Please call with questions.  Richard Sexton. Richard Plaza NP-C    06/12/2020, 10:04 AM [address] [place] [address] Office [phone] Fax [phone]

## 2020-06-15 ENCOUNTER — Telehealth: Payer: Self-pay

## 2020-06-15 NOTE — Telephone Encounter (Signed)
Surgical clearance form received from [place]. Spoke with pt to schedule the appt and he states he is not going to be having the surgery at this time d/t financial reasons. He said he is working on getting specific coverage and saving the money for the surgery. I told him that when he called [facility] to schedule the surgery that he will also need to call us and schedule the surgical clearance OV. No further questions or concerns at this time.

## 2020-06-24 ENCOUNTER — Ambulatory Visit (INDEPENDENT_AMBULATORY_CARE_PROVIDER_SITE_OTHER): Payer: 59

## 2020-06-24 ENCOUNTER — Other Ambulatory Visit: Payer: Self-pay

## 2020-06-24 ENCOUNTER — Ambulatory Visit (INDEPENDENT_AMBULATORY_CARE_PROVIDER_SITE_OTHER): Payer: 59 | Admitting: Sports Medicine

## 2020-06-24 DIAGNOSIS — M25461 Effusion, right knee: Secondary | ICD-10-CM

## 2020-06-24 DIAGNOSIS — M25462 Effusion, left knee: Secondary | ICD-10-CM

## 2020-06-24 MED ORDER — TRAMADOL HCL 50 MG PO TABS: 50.0000 mg | ORAL_TABLET | Freq: Three times a day (TID) | ORAL | 0 refills | Status: DC | PRN

## 2020-06-24 MED ORDER — DICLOFENAC SODIUM 75 MG PO TBEC: 75.0000 mg | DELAYED_RELEASE_TABLET | Freq: Two times a day (BID) | ORAL | 3 refills | Status: DC

## 2020-06-24 NOTE — Assessment & Plan Note (Addendum)
Richard Sexton returns, he has knee osteoarthritis with meniscal maceration, he did see Dr. Berenice Primas, arthroscopy is planned but he is going to need to meet his deductible first, he will continue with tramadol for breakthrough pain, happy to refill this, I am going to switch him from ibuprofen to Voltaren twice daily. I aspirated his left knee and injected it, we only aspirated his right knee today (injected 2 months ago). This is an exacerbation of a chronic disease process with prescription drug management. Return to see me as needed.

## 2020-06-24 NOTE — Progress Notes (Signed)
    Procedures performed today:    Procedure: Real-time Ultrasound Guided aspiration/injection of left knee Device: Samsung HS60  Verbal informed consent obtained.  Time-out conducted.  Noted no overlying erythema, induration, or other signs of local infection.  Skin prepped in a sterile fashion.  Local anesthesia: Topical Ethyl chloride.  With sterile technique and under real time ultrasound guidance:  Aspirated 30 mL of clear, straw-colored fluid, syringe switched and 1 cc Kenalog 40, 2 cc lidocaine, 2 cc bupivacaine injected easily Completed without difficulty  Advised to call if fevers/chills, erythema, induration, drainage, or persistent bleeding.  Images permanently stored and available for review in PACS.  Impression: Technically successful ultrasound guided injection.  Procedure: Real-time Ultrasound Guided aspiration of right knee Device: Samsung HS60  Verbal informed consent obtained.  Time-out conducted.  Noted no overlying erythema, induration, or other signs of local infection.  Skin prepped in a sterile fashion.  Local anesthesia: Topical Ethyl chloride.  With sterile technique and under real time ultrasound guidance:  Noted effusion, using an 18-gauge needle I aspirated 61 mL of clear, straw-colored fluid.  No injection performed. Completed without difficulty  Advised to call if fevers/chills, erythema, induration, drainage, or persistent bleeding.  Images permanently stored and available for review in PACS.  Impression: Technically successful ultrasound guided injection.  Independent interpretation of notes and tests performed by another provider:   None.  Brief History, Exam, Impression, and Recommendations:    Bilateral knee swelling Wilkins returns, he has knee osteoarthritis with meniscal maceration, he did see Dr. Berenice Primas, arthroscopy is planned but he is going to need to meet his deductible first, he will continue with tramadol for breakthrough pain, happy to  refill this, I am going to switch him from ibuprofen to Voltaren twice daily. I aspirated his left knee and injected it, we only aspirated his right knee today (injected 2 months ago). This is an exacerbation of a chronic disease process with prescription drug management. Return to see me as needed.     ___________________________________________ Richard Sexton. Dianah Field, M.D., ABFM., CAQSM. Primary Care and [employer] Instructor of [place] of [facility]

## 2020-06-29 ENCOUNTER — Encounter (HOSPITAL_BASED_OUTPATIENT_CLINIC_OR_DEPARTMENT_OTHER): Payer: Self-pay

## 2020-06-29 ENCOUNTER — Ambulatory Visit (HOSPITAL_BASED_OUTPATIENT_CLINIC_OR_DEPARTMENT_OTHER): Admit: 2020-06-29 | Payer: 59 | Admitting: Orthopedic Surgery

## 2020-06-29 SURGERY — ARTHROSCOPY, KNEE
Anesthesia: Choice | Site: Knee | Laterality: Bilateral

## 2020-07-23 ENCOUNTER — Telehealth: Payer: Self-pay

## 2020-07-23 NOTE — Telephone Encounter (Signed)
Patient aware he will need to contact his insurance to get the names of covered providers that we can choose from.

## 2020-07-23 NOTE — Telephone Encounter (Signed)
Patient called stating that Dr. Berenice Primas is out of network for him. He would like you to refer him to someone in network.

## 2020-07-23 NOTE — Telephone Encounter (Signed)
I really have no idea who is in network, he just needs to let me know what health system to choose, he can also look through his insurance company list of preferred providers and I can refer him to whoever he chooses.

## 2020-08-10 ENCOUNTER — Other Ambulatory Visit: Payer: Self-pay | Admitting: Medical-Surgical

## 2020-08-10 DIAGNOSIS — I10 Essential (primary) hypertension: Secondary | ICD-10-CM

## 2020-08-11 NOTE — Telephone Encounter (Signed)
Sounds good, we can just discuss this in further detail at the follow-up appointment.

## 2020-08-11 NOTE — Telephone Encounter (Signed)
Patient has new insurance I resubmitted through [place] with [employer] to see what they will pay for and if it requires a PA - CF

## 2020-08-11 NOTE — Telephone Encounter (Signed)
Pt states his BP readings have been pretty good. His last check was 130/80 three days ago and states that is about what it has been running lately. He was at work and did not have access to numbers.

## 2020-08-11 NOTE — Telephone Encounter (Signed)
Patient called stating that Dr. Berenice Primas was in network but insurance is stating that he doesn't need the surgery and is not going to cover it. He wants to see another Psychologist, sport and exercise.  Patient requested an appt with Dr. Darene Lamer to have his knees drained again and to discuss this issue. Please call patient  to schedule appt.

## 2020-08-12 NOTE — Telephone Encounter (Signed)
Left Voicemail for patient to call back to get this appt with Dr T scheduled. AM

## 2020-08-31 ENCOUNTER — Other Ambulatory Visit: Payer: Self-pay

## 2020-08-31 ENCOUNTER — Ambulatory Visit (INDEPENDENT_AMBULATORY_CARE_PROVIDER_SITE_OTHER): Payer: 59

## 2020-08-31 ENCOUNTER — Ambulatory Visit (INDEPENDENT_AMBULATORY_CARE_PROVIDER_SITE_OTHER): Payer: 59 | Admitting: Family Medicine

## 2020-08-31 ENCOUNTER — Ambulatory Visit: Payer: Self-pay

## 2020-08-31 VITALS — BP 144/102 | HR 103 | Ht 67.0 in | Wt 240.0 lb

## 2020-08-31 DIAGNOSIS — M25461 Effusion, right knee: Secondary | ICD-10-CM

## 2020-08-31 DIAGNOSIS — G8929 Other chronic pain: Secondary | ICD-10-CM

## 2020-08-31 DIAGNOSIS — M25562 Pain in left knee: Secondary | ICD-10-CM | POA: Diagnosis not present

## 2020-08-31 DIAGNOSIS — M25462 Effusion, left knee: Secondary | ICD-10-CM

## 2020-08-31 DIAGNOSIS — M25561 Pain in right knee: Secondary | ICD-10-CM

## 2020-08-31 MED ORDER — PENNSAID 2 % EX SOLN: 1.0000 | Freq: Two times a day (BID) | CUTANEOUS | 1 refills | Status: DC

## 2020-08-31 NOTE — Patient Instructions (Addendum)
  Thank you for coming in today.   Please use Voltaren gel (Generic Diclofenac Gel) up to 4x daily for pain as needed.  This is available over-the-counter as both the name brand Voltaren gel and the generic diclofenac gel.   Or Pensaid. Is better assuming you can get it.  Pennsaid instructions: You have been given a sample/prescription for Pennsaid, a topical medication.    You are to apply this gel to your injured body part twice daily (morning and evening).  A little goes a long way so you can use about a pea-sized amount for each area.  Spread this small amount over the area into a thin film and let it dry.  Be sure that you do not rub the gel into your skin for more than 10 or 15 seconds otherwise it can irritate you skin.   Once you apply the gel, please do not put any other lotion or clothing in contact with that area for 30 minutes to allow the gel to absorb into your skin.  Some people are sensitive to the medication and can develop a sunburn-like rash.  If you have only mild symptoms it is okay to continue to use the medication but if you have any breakdown of your skin you should discontinue its use and please let us know.  If you have been written a prescription for Pennsaid, you will receive a pump bottle of this topical gel through a mail order pharmacy.  The instructions on the bottle will say to apply two pumps twice a day which may be too much gel for your particular area so use the pea-sized amount as your guide.  Return soon for gel shot(s)  If those dont work we can we get you set up with surgery.  Please get an Xray today before you leave

## 2020-08-31 NOTE — Progress Notes (Signed)
I, Peterson Lombard, LAT, ATC acting as a scribe for Lynne Leader, MD.  Subjective:    CC: Bilat knee pain  HPI: Pt is a 55 y/o male c/o B knee pain, L>R . Pt was previously seen by Dr. Darene Lamer on 06/24/20 for bilat knee pain and was given B knee steroid injections and aspirated L knee. Pt was for B knee arthroscopy w/ Dr. Berenice Primas, on 06/29/20, but had to cancel due to his insurance not receiving paperwork. Today, pt locates pain to the posterior and anterior aspect bilat. Pt reports of hx of having B knees aspirated  Knee swelling: slight Mechanical symptoms: yes Aggravates: being on his feet for long periods of times Treatments tried: steroid injections, IBU  Dx imaging: 02/22/20 L knee MRI  02/04/20 L knee XR  Pertinent review of Systems: No fevers or chills  Relevant historical information: Hypertension   Objective:    Vitals:   08/31/20 1513  BP: (!) 144/102  Pulse: (!) 103  SpO2: 95%   General: Well Developed, well nourished, and in no acute distress.   MSK: Knees bilaterally mild joint effusion normal motion with crepitation.  Tender palpation medial joint line. Positive McMurray test bilaterally.  Lab and Radiology Results  EXAM: MRI OF THE LEFT KNEE WITHOUT CONTRAST   TECHNIQUE: Multiplanar, multisequence MR imaging of the knee was performed. No intravenous contrast was administered.   COMPARISON:  Left knee x-rays dated February 04, 2020.   FINDINGS: MENISCI   Medial meniscus:  Intact.   Lateral meniscus:  Complex tear of the anterior horn near the root   LIGAMENTS   Cruciates:  Intact ACL and PCL.   Collaterals: Medial collateral ligament is intact. Lateral collateral ligament complex is intact.   CARTILAGE   Patellofemoral: Small full-thickness cartilage defect over the lateral trochlea with underlying subchondral marrow edema.   Medial:  No chondral defect.   Lateral: Small focal full-thickness cartilage defect over the posterior  weight-bearing lateral femoral condyle.   Joint: Moderate joint effusion. Prominent edema within Hoffa's fat.   Popliteal Fossa: Small complex Baker cyst. Intact popliteus tendon.   Extensor Mechanism: Intact quadriceps tendon and patellar tendon. Mild proximal patellar tendinosis. Intact medial and lateral patellar retinaculum. Intact MPFL.   Bones: No acute fracture or dislocation. No suspicious bone lesion.   Other: None.   IMPRESSION: 1. Complex tear of the lateral meniscus anterior horn near the root. 2. Mild proximal patellar tendinosis. 3. Mild lateral and patellofemoral compartment osteoarthritis. 4. Moderate joint effusion. Small Baker cyst.     Electronically Signed   By: Titus Dubin M.D.   On: 02/22/2020 17:12   I, Lynne Leader, personally (independently) visualized and performed the interpretation of the images attached in this note.   X-ray images right knee obtained today personally and independently interpreted Intact appearing femur IM nail.  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  Mild medial and lateral DJD. Await formal radiology review    Impression and Recommendations:    Assessment and Plan: 55 y.o. male with bilateral knee pain left worse than right.  Patient does have some mechanical symptoms in his left knee however his dominant issue is pain and swelling.  He does have a meniscus tear seen on MRI from January of this year.  He was set up for surgical debridement with Dr. Berenice Primas but there is a problem with the authorization of the surgery and it was never done.  He has had repeated steroid injections however they are not lasting  even 3 months at this point.  We discussed his options.  Plan to continue conservative management.  We will prescribe Pennsaid now with a backup plan for Voltaren gel if Pennsaid is not obtainable.  Additionally will work on authorization for hyaluronic acid injections.  If this is not working then it would make sense to refer  back to orthopedic surgery for surgical consultation for his left knee.Marland Kitchen  PDMP not reviewed this encounter. Orders Placed This Encounter  Procedures   DG Knee AP/LAT W/Sunrise Right    Standing Status:   Future    Number of Occurrences:   1    Standing Expiration Date:   08/31/2021    Order Specific Question:   Reason for Exam (SYMPTOM  OR DIAGNOSIS REQUIRED)    Answer:   eval knee r    Order Specific Question:   Preferred imaging location?    Answer:   [facility]   Meds ordered this encounter  Medications   PENNSAID 2 % SOLN    Sig: Apply 1 Pump topically in the morning and at bedtime.    Dispense:  112 g    Refill:  1    Discussed warning signs or symptoms. Please see discharge instructions. Patient expresses understanding.   The above documentation has been reviewed and is accurate and complete Lynne Leader, M.D.

## 2020-09-01 NOTE — Progress Notes (Signed)
Right knee x-ray does show some mild arthritis as well as the rod in the femur that you talked about.

## 2020-09-07 ENCOUNTER — Ambulatory Visit: Payer: 59 | Admitting: Sports Medicine

## 2020-09-25 ENCOUNTER — Ambulatory Visit (INDEPENDENT_AMBULATORY_CARE_PROVIDER_SITE_OTHER): Payer: 59 | Admitting: Family Medicine

## 2020-09-25 ENCOUNTER — Ambulatory Visit: Payer: Self-pay

## 2020-09-25 ENCOUNTER — Other Ambulatory Visit: Payer: Self-pay

## 2020-09-25 DIAGNOSIS — M25562 Pain in left knee: Secondary | ICD-10-CM

## 2020-09-25 DIAGNOSIS — G8929 Other chronic pain: Secondary | ICD-10-CM | POA: Diagnosis not present

## 2020-09-25 DIAGNOSIS — M170 Bilateral primary osteoarthritis of knee: Secondary | ICD-10-CM | POA: Diagnosis not present

## 2020-09-25 DIAGNOSIS — M25561 Pain in right knee: Secondary | ICD-10-CM

## 2020-09-25 NOTE — Progress Notes (Signed)
Richard Sexton presents to clinic today for Gelsyn injection bilateral knees  Procedure: Real-time Ultrasound Guided Injection of right knee superior lateral patellar space Device: Philips Affiniti 50G Images permanently stored and available for review in PACS Verbal informed consent obtained.  Discussed risks and benefits of procedure. Warned about infection bleeding damage to structures skin hypopigmentation and fat atrophy among others. Patient expresses understanding and agreement Time-out conducted.   Noted no overlying erythema, induration, or other signs of local infection.   Skin prepped in a sterile fashion.   Local anesthesia: Topical Ethyl chloride.   With sterile technique and under real time ultrasound guidance: Gelsyn 16.8 mg injected into right knee joint. Fluid seen entering the joint capsule.   Completed without difficulty   Advised to call if fevers/chills, erythema, induration, drainage, or persistent bleeding.   Images permanently stored and available for review in the ultrasound unit.  Impression: Technically successful ultrasound guided injection.   Procedure: Real-time Ultrasound Guided Injection of left knee superior lateral patellar space Device: Philips Affiniti 50G Images permanently stored and available for review in PACS Verbal informed consent obtained.  Discussed risks and benefits of procedure. Warned about infection bleeding damage to structures skin hypopigmentation and fat atrophy among others. Patient expresses understanding and agreement Time-out conducted.   Noted no overlying erythema, induration, or other signs of local infection.   Skin prepped in a sterile fashion.   Local anesthesia: Topical Ethyl chloride.   With sterile technique and under real time ultrasound guidance: Gelsyn 16.8 mg injected into left knee joint. Fluid seen entering the joint capsule Completed without difficulty   Advised to call if fevers/chills, erythema, induration, drainage, or  persistent bleeding.   Images permanently stored and available for review in the ultrasound unit.  Impression: Technically successful ultrasound guided injection.    Lot number: [id number] both injections

## 2020-09-25 NOTE — Patient Instructions (Signed)
Thank you for coming in today.   Schedule next Thursday last visit and the next week.  Ok to double book the next visit if needed.   Call or go to the ER if you develop a large red swollen joint with extreme pain or oozing puss.

## 2020-10-01 ENCOUNTER — Other Ambulatory Visit: Payer: Self-pay

## 2020-10-01 ENCOUNTER — Ambulatory Visit (INDEPENDENT_AMBULATORY_CARE_PROVIDER_SITE_OTHER): Payer: 59 | Admitting: Family Medicine

## 2020-10-01 ENCOUNTER — Ambulatory Visit: Payer: Self-pay

## 2020-10-01 DIAGNOSIS — M170 Bilateral primary osteoarthritis of knee: Secondary | ICD-10-CM

## 2020-10-01 DIAGNOSIS — M25562 Pain in left knee: Secondary | ICD-10-CM | POA: Diagnosis not present

## 2020-10-01 DIAGNOSIS — M25561 Pain in right knee: Secondary | ICD-10-CM | POA: Diagnosis not present

## 2020-10-01 DIAGNOSIS — G8929 Other chronic pain: Secondary | ICD-10-CM

## 2020-10-01 NOTE — Patient Instructions (Addendum)
Thank you for coming in today.   You received a gel injection today. Seek immediate medical attention if the joint becomes red, extremely painful, or is oozing fluid.   We will see you next week for the 3rd gel injection.

## 2020-10-01 NOTE — Progress Notes (Signed)
Richard Sexton presents to clinic today for Gelsyn injection bilateral knees 2/3  Procedure: Real-time Ultrasound Guided Injection of right knee superior lateral patellar space Device: Philips Affiniti 50G Images permanently stored and available for review in PACS Verbal informed consent obtained.  Discussed risks and benefits of procedure. Warned about infection bleeding damage to structures skin hypopigmentation and fat atrophy among others. Patient expresses understanding and agreement Time-out conducted.   Noted no overlying erythema, induration, or other signs of local infection.   Skin prepped in a sterile fashion.   Local anesthesia: Topical Ethyl chloride.   With sterile technique and under real time ultrasound guidance: Gelsyn 16.8 mg injected into right knee joint. Fluid seen entering the joint capsule.   Completed without difficulty   Advised to call if fevers/chills, erythema, induration, drainage, or persistent bleeding.   Images permanently stored and available for review in the ultrasound unit.  Impression: Technically successful ultrasound guided injection.  Lot number: [id number]   Procedure: Real-time Ultrasound Guided Injection of left knee superior lateral patellar space Device: Philips Affiniti 50G Images permanently stored and available for review in PACS Verbal informed consent obtained.  Discussed risks and benefits of procedure. Warned about infection bleeding damage to structures skin hypopigmentation and fat atrophy among others. Patient expresses understanding and agreement Time-out conducted.   Noted no overlying erythema, induration, or other signs of local infection.   Skin prepped in a sterile fashion.   Local anesthesia: Topical Ethyl chloride.   With sterile technique and under real time ultrasound guidance: Gelsyn 16.8 mg injected into knee joint. Fluid seen entering the joint capsule.   Completed without difficulty   Advised to call if fevers/chills, erythema,  induration, drainage, or persistent bleeding.   Images permanently stored and available for review in the ultrasound unit.  Impression: Technically successful ultrasound guided injection.  Lot number [id number]  Return in 1 week for Gelsyn injection bilateral knees 3/3

## 2020-10-08 ENCOUNTER — Ambulatory Visit: Payer: 59 | Admitting: Family Medicine

## 2020-10-13 ENCOUNTER — Ambulatory Visit (INDEPENDENT_AMBULATORY_CARE_PROVIDER_SITE_OTHER): Payer: 59 | Admitting: Family Medicine

## 2020-10-13 ENCOUNTER — Ambulatory Visit: Payer: Self-pay

## 2020-10-13 ENCOUNTER — Other Ambulatory Visit: Payer: Self-pay

## 2020-10-13 DIAGNOSIS — M170 Bilateral primary osteoarthritis of knee: Secondary | ICD-10-CM | POA: Diagnosis not present

## 2020-10-13 DIAGNOSIS — M25561 Pain in right knee: Secondary | ICD-10-CM | POA: Diagnosis not present

## 2020-10-13 DIAGNOSIS — M25562 Pain in left knee: Secondary | ICD-10-CM | POA: Diagnosis not present

## 2020-10-13 DIAGNOSIS — G8929 Other chronic pain: Secondary | ICD-10-CM | POA: Diagnosis not present

## 2020-10-13 NOTE — Progress Notes (Signed)
Richard Sexton presents to clinic today for Gelsyn injection bilateral knees 3/3  Procedure: Real-time Ultrasound Guided Injection of right knee superior lateral patellar space Device: Philips Affiniti 50G Images permanently stored and available for review in PACS Verbal informed consent obtained.  Discussed risks and benefits of procedure. Warned about infection bleeding damage to structures skin hypopigmentation and fat atrophy among others. Patient expresses understanding and agreement Time-out conducted.   Noted no overlying erythema, induration, or other signs of local infection.   Skin prepped in a sterile fashion.   Local anesthesia: Topical Ethyl chloride.   With sterile technique and under real time ultrasound guidance: Gelsyn 16.8 mg injected into knee joint. Fluid seen entering the joint capsule.   Completed without difficulty     Advised to call if fevers/chills, erythema, induration, drainage, or persistent bleeding.   Images permanently stored and available for review in the ultrasound unit.  Impression: Technically successful ultrasound guided injection.    Procedure: Real-time Ultrasound Guided Injection of left knee superior lateral patellar space Device: Philips Affiniti 50G Images permanently stored and available for review in PACS Verbal informed consent obtained.  Discussed risks and benefits of procedure. Warned about infection bleeding damage to structures skin hypopigmentation and fat atrophy among others. Patient expresses understanding and agreement Time-out conducted.   Noted no overlying erythema, induration, or other signs of local infection.   Skin prepped in a sterile fashion.   Local anesthesia: Topical Ethyl chloride.   With sterile technique and under real time ultrasound guidance: Gelsyn 16.8 mg injected into knee joint. Fluid seen entering the joint capsule.   Completed without difficulty    Advised to call if fevers/chills, erythema, induration, drainage, or  persistent bleeding.   Images permanently stored and available for review in the ultrasound unit.  Impression: Technically successful ultrasound guided injection.    Lot number: [id number] for both injections  Recheck as needed

## 2020-10-13 NOTE — Patient Instructions (Signed)
Good to see you today.  You had B knee Gelsyn injections.  Call or go to the ER if you develop a large red swollen joint with extreme pain or oozing puss.   Please make sure you schedule 2 follow-up visits over the next 2 weeks (1 appointment/week for the next 2 weeks) for the 2nd and 3rd rounds of Gelsyn injections.

## 2020-10-27 ENCOUNTER — Encounter: Payer: Self-pay | Admitting: Medical-Surgical

## 2020-10-27 ENCOUNTER — Ambulatory Visit (INDEPENDENT_AMBULATORY_CARE_PROVIDER_SITE_OTHER): Payer: 59 | Admitting: Medical-Surgical

## 2020-10-27 VITALS — BP 142/97 | HR 80 | Resp 20 | Ht 67.0 in | Wt 240.0 lb

## 2020-10-27 DIAGNOSIS — N529 Male erectile dysfunction, unspecified: Secondary | ICD-10-CM | POA: Diagnosis not present

## 2020-10-27 DIAGNOSIS — I10 Essential (primary) hypertension: Secondary | ICD-10-CM | POA: Diagnosis not present

## 2020-10-27 DIAGNOSIS — D696 Thrombocytopenia, unspecified: Secondary | ICD-10-CM | POA: Diagnosis not present

## 2020-10-27 MED ORDER — SILDENAFIL CITRATE 100 MG PO TABS: 50.0000 mg | ORAL_TABLET | Freq: Every day | ORAL | 11 refills | Status: DC | PRN

## 2020-10-27 MED ORDER — LOSARTAN POTASSIUM 50 MG PO TABS: 50.0000 mg | ORAL_TABLET | Freq: Every day | ORAL | 1 refills | Status: DC

## 2020-10-27 NOTE — Progress Notes (Signed)
  HPI with pertinent ROS:   CC: Hypertension follow-up  HPI: Pleasant 55 year old male presenting today for hypertension follow-up.  He has been taking amlodipine 5 mg and losartan 25 mg daily as prescribed, tolerating well without side effects.  Notes he did just come from work and ate teriyaki wings before he arrived.  Checks his blood pressure at home with systolic readings [id number].  He is active at work but not intentionally exercising due to limitations with bilateral knee pain. Denies CP, SOB, palpitations, lower extremity edema, dizziness, headaches, or vision changes.  Having issues with erectile dysfunction.  He can get erections without difficulty but has difficulty maintaining them.  He has taken Viagra for this in the past, noting that the 50 mg dose worked best for him.  He has not tried any supplemental measures such as rings or vacuum devices.  I reviewed the past medical history, family history, social history, surgical history, and allergies today and no changes were needed.  Please see the problem list section below in epic for further details.   Physical exam:   General: Well Developed, well nourished, and in no acute distress.  Neuro: Alert and oriented x3.  HEENT: Normocephalic, atraumatic.  Skin: Warm and dry. Cardiac: Regular rate and rhythm, no murmurs rubs or gallops, no lower extremity edema.  Respiratory: Clear to auscultation bilaterally. Not using accessory muscles, speaking in full sentences.  Impression and Recommendations:    1. Essential hypertension Checking CMP and lipid panel today.  Increasing losartan to 50 mg daily.  Continue atorvastatin 5 mg daily.  Continue checking blood pressures at home with goal of 130/80 or less. - COMPLETE METABOLIC PANEL WITH GFR - Lipid panel - losartan (COZAAR) 50 MG tablet; Take 1 tablet (50 mg total) by mouth daily.  Dispense: 90 tablet; Refill: 1  2. Thrombocytopenia (HCC) Last 2 CBCs that showed low platelets so  rechecking CBC with differential today. - CBC with Differential/Platelet  3. Vasculogenic erectile dysfunction, unspecified vasculogenic erectile dysfunction type Starting Viagra 50-100 mg daily as needed.  Reviewed recommended avoidance of nitrates while taking this medication.  He is not currently prescribed nitrates and is aware to notify EMS of his prescription should he ever find himself in that position.  Return in about 2 weeks (around 11/10/2020) for nurse visit for BP check. ___________________________________________ Clearnce Sorrel, DNP, APRN, FNP-BC Primary Care and [place]

## 2020-10-28 LAB — COMPLETE METABOLIC PANEL WITH GFR
AG Ratio: 1.4 (calc) (ref 1.0–2.5)
ALT: 29 U/L (ref 9–46)
AST: 33 U/L (ref 10–35)
Albumin: 4.2 g/dL (ref 3.6–5.1)
Alkaline phosphatase (APISO): 49 U/L (ref 35–144)
BUN: 17 mg/dL (ref 7–25)
CO2: 32 mmol/L (ref 20–32)
Calcium: 9.8 mg/dL (ref 8.6–10.3)
Chloride: 102 mmol/L (ref 98–110)
Creat: 1.22 mg/dL (ref 0.70–1.30)
Globulin: 3 g/dL (calc) (ref 1.9–3.7)
Glucose, Bld: 74 mg/dL (ref 65–99)
Potassium: 4.8 mmol/L (ref 3.5–5.3)
Sodium: 141 mmol/L (ref 135–146)
Total Bilirubin: 0.4 mg/dL (ref 0.2–1.2)
Total Protein: 7.2 g/dL (ref 6.1–8.1)
eGFR: 70 mL/min/{1.73_m2} (ref >=60)

## 2020-10-28 LAB — CBC WITH DIFFERENTIAL/PLATELET
Absolute Monocytes: 708 cells/uL (ref 200–950)
Basophils Absolute: 30 cells/uL (ref 0–200)
Basophils Relative: 0.5 %
Eosinophils Absolute: 142 cells/uL (ref 15–500)
Eosinophils Relative: 2.4 %
HCT: 45.3 % (ref 38.5–50.0)
Hemoglobin: 14.7 g/dL (ref 13.2–17.1)
Lymphs Abs: 1918 cells/uL (ref 850–3900)
MCH: 29.2 pg (ref 27.0–33.0)
MCHC: 32.5 g/dL (ref 32.0–36.0)
MCV: 90.1 fL (ref 80.0–100.0)
MPV: 12.6 fL — ABNORMAL HIGH (ref 7.5–12.5)
Monocytes Relative: 12 %
Neutro Abs: 3103 cells/uL (ref 1500–7800)
Neutrophils Relative %: 52.6 %
Platelets: 148 10*3/uL (ref 140–400)
RBC: 5.03 10*6/uL (ref 4.20–5.80)
RDW: 14.2 % (ref 11.0–15.0)
Total Lymphocyte: 32.5 %
WBC: 5.9 10*3/uL (ref 3.8–10.8)

## 2020-10-28 LAB — LIPID PANEL
Cholesterol: 155 mg/dL (ref <200)
HDL: 54 mg/dL (ref >=40)
LDL Cholesterol (Calc): 76 mg/dL (calc)
Non-HDL Cholesterol (Calc): 101 mg/dL (calc) (ref <130)
Total CHOL/HDL Ratio: 2.9 (calc) (ref <5.0)
Triglycerides: 152 mg/dL — ABNORMAL HIGH (ref <150)

## 2020-11-10 ENCOUNTER — Ambulatory Visit (INDEPENDENT_AMBULATORY_CARE_PROVIDER_SITE_OTHER): Payer: 59 | Admitting: Physician Assistant

## 2020-11-10 VITALS — BP 129/95 | HR 97 | Temp 97.4°F

## 2020-11-10 DIAGNOSIS — I10 Essential (primary) hypertension: Secondary | ICD-10-CM

## 2020-11-10 NOTE — Progress Notes (Signed)
Patient presents today as a nurse visit for a blood pressure check.  Patient states he  is taking his medication as prescribed without any side effects/adverse effects. Medication and allergy list reviewed with patient and the pharmacy has been verified.   HA: No Dizziness/lightheadedness: No Fever: No BA: No Weakness/Fatigue: No  Sinus pain/pressure: No  Runny nose: No  ST: No  ShOB: No  CP: No  Palps: No Abd pain: No Dysuria: No  N/V/C/D: No    Vital Signs at 3:34 PM Blood Pressure: 144/97 Pulse: 89 SpO2: 98%  Vital Signs at 3:45 PM Blood Pressure: 129/95 Pulse: 97 SpO2: 99%   Information shared with Iran Planas, PA-C  who instructed me to tell pt to continue losartan 50 mg but increase amlodipine 5 mg to taking 1 1/2 tablets (7.5 mg) daily, continue to monitor BP at home and keep a log, and to schedule a f/u NV in 2 weeks for a recheck/   Pt aware and verbalized understanding. Pt scheduled for a NV on 11/26/20 at 3:30 PM for a BP recheck.

## 2020-11-10 NOTE — Patient Instructions (Signed)
MEDICATION CHANGE:  Increase amlodipine to taking 1 1/2 tablets daily (7.5 mg) Continue losartan 50 mg  Return in 2 weeks for a nurse visit to recheck your blood pressure This has been scheduled for 11/26/20 at 3:30 PM

## 2020-11-26 ENCOUNTER — Other Ambulatory Visit: Payer: Self-pay

## 2020-11-26 ENCOUNTER — Ambulatory Visit (INDEPENDENT_AMBULATORY_CARE_PROVIDER_SITE_OTHER): Payer: 59 | Admitting: Medical-Surgical

## 2020-11-26 DIAGNOSIS — I10 Essential (primary) hypertension: Secondary | ICD-10-CM

## 2020-11-26 MED ORDER — AMLODIPINE BESYLATE 5 MG PO TABS: ORAL_TABLET | ORAL | 1 refills | Status: DC

## 2020-11-26 NOTE — Progress Notes (Deleted)
Patient presents today as a nurse visit for a blood pressure check.  Patient states {he/she/they:23295} is taking {his/her/their:21314} medication as prescribed without any side effects/adverse effects. Medication and allergy list reviewed with patient and the pharmacy has been verified.   HA: {yes/no:20286} Dizziness/lightheadedness: {yes/no:20286} Fever: {yes/no:20286} BA: {yes/no:20286} Weakness/Fatigue: {yes/no:20286}  Sinus pain/pressure: {yes/no:20286}  Runny nose: {yes/no:20286}  ST: {yes/no:20286}  ShOB: {yes/no:20286}  CP: {yes/no:20286}  Palps: {yes/no:20286} Abd pain: {yes/no:20286} Dysuria: {yes/no:20286}  N/V/C/D: {yes/no:20286}    Vital Signs at ***:*** ***M Blood Pressure: *** Pulse: *** SpO2: ***%  Vital Signs at ***:*** ***M Blood Pressure: *** Pulse: *** SpO2: ***%   Information shared with *** who instructed me to tell pt to ***, continue to monitor BP at home and keep a log, and to schedule a f/u ***V in *** months.   Pt aware and verbalized understanding. Pt escorted to check out for scheduling.

## 2020-11-26 NOTE — Progress Notes (Signed)
Patient is here for a blood pressure check. Denies chest pains, lightheadedness, palpitations, SOB, headaches or medication problems. Per patient, he did not adjust his amlodipine rx as informed at his last visit. Initial blood pressure reading was not at goal. Patient sat for 10 minutes. Second blood pressure was not at goal. Patient informed to schedule an appointment 3 - 4 weeks with provider for a follow up on blood pressure/medications. At patient's request amlodipine refill sent to the pharmacy.

## 2020-12-03 ENCOUNTER — Ambulatory Visit: Payer: Self-pay

## 2020-12-03 ENCOUNTER — Ambulatory Visit (INDEPENDENT_AMBULATORY_CARE_PROVIDER_SITE_OTHER): Payer: 59 | Admitting: Family Medicine

## 2020-12-03 ENCOUNTER — Other Ambulatory Visit: Payer: Self-pay

## 2020-12-03 VITALS — BP 130/84 | HR 110 | Ht 67.0 in | Wt 237.6 lb

## 2020-12-03 DIAGNOSIS — M25562 Pain in left knee: Secondary | ICD-10-CM | POA: Diagnosis not present

## 2020-12-03 NOTE — Progress Notes (Signed)
I, Peterson Lombard, LAT, ATC acting as a scribe for Lynne Leader, MD.  Richard Sexton is a 55 y.o. male who presents to [place] at [facility] today for L knee pain. Pt was last seen by Dr. Georgina Snell on 10/13/20 for chronic bilat knee pain completing the Gelsyn series in both knees. Today, pt reports he "twisted" his L knee when going down the stairs when he was taking the trash out on Sunday, 10/16.  Pt locates pain to all over his L knee.  L knee swelling: yes Mechanical symptoms: yes Aggravates: cold weather Treatments tried: IBU  Dx imaging: 02/22/20 L knee MRI 02/04/20 L knee XR   Pertinent review of systems: No fevers or chills  Relevant historical information: Hypertension. Degenerative meniscus tear left knee.   Exam:  BP 130/84   Pulse (!) 110   Ht 5\' 7"  (1.702 m)   Wt 237 lb 9.6 oz (107.8 kg)   SpO2 95%   BMI 37.21 kg/m  General: Well Developed, well nourished, and in no acute distress.   MSK: Left knee mild effusion normal motion with crepitation.  Tender palpation lateral joint line.    Lab and Radiology Results  Procedure: Real-time Ultrasound Guided Injection of left knee superior lateral patellar space Device: Philips Affiniti 50G Images permanently stored and available for review in PACS Ultrasound evaluation prior to injection reveals a mild joint effusion. Verbal informed consent obtained.  Discussed risks and benefits of procedure. Warned about infection bleeding damage to structures skin hypopigmentation and fat atrophy among others. Patient expresses understanding and agreement Time-out conducted.   Noted no overlying erythema, induration, or other signs of local infection.   Skin prepped in a sterile fashion.   Local anesthesia: Topical Ethyl chloride.   With sterile technique and under real time ultrasound guidance: 40 mg of Kenalog and 2 mL of lidocaine injected into knee joint. Fluid seen entering the joint capsule.   Completed  without difficulty   Pain immediately resolved suggesting accurate placement of the medication.   Advised to call if fevers/chills, erythema, induration, drainage, or persistent bleeding.   Images permanently stored and available for review in the ultrasound unit.  Impression: Technically successful ultrasound guided injection.    EXAM: MRI OF THE LEFT KNEE WITHOUT CONTRAST   TECHNIQUE: Multiplanar, multisequence MR imaging of the knee was performed. No intravenous contrast was administered.   COMPARISON:  Left knee x-rays dated February 04, 2020.   FINDINGS: MENISCI   Medial meniscus:  Intact.   Lateral meniscus:  Complex tear of the anterior horn near the root   LIGAMENTS   Cruciates:  Intact ACL and PCL.   Collaterals: Medial collateral ligament is intact. Lateral collateral ligament complex is intact.   CARTILAGE   Patellofemoral: Small full-thickness cartilage defect over the lateral trochlea with underlying subchondral marrow edema.   Medial:  No chondral defect.   Lateral: Small focal full-thickness cartilage defect over the posterior weight-bearing lateral femoral condyle.   Joint: Moderate joint effusion. Prominent edema within Hoffa's fat.   Popliteal Fossa: Small complex Baker cyst. Intact popliteus tendon.   Extensor Mechanism: Intact quadriceps tendon and patellar tendon. Mild proximal patellar tendinosis. Intact medial and lateral patellar retinaculum. Intact MPFL.   Bones: No acute fracture or dislocation. No suspicious bone lesion.   Other: None.   IMPRESSION: 1. Complex tear of the lateral meniscus anterior horn near the root. 2. Mild proximal patellar tendinosis. 3. Mild lateral and patellofemoral compartment osteoarthritis. 4. Moderate  joint effusion. Small Baker cyst.     Electronically Signed   By: Titus Dubin M.D.   On: 02/22/2020 17:12   I, Lynne Leader, personally (independently) visualized and performed the interpretation of  the images attached in this note.      Assessment and Plan: 55 y.o. male with left knee pain.  Patient had a series of aspirations and steroid injections earlier this year with Dr. Dianah Field with some benefit.  He recently had a hyaluronic acid series bilateral knees with me ending on August 30.  He reinjured his knee recently and now has knee pain and swelling left knee.  This is likely an exacerbation of his underlying degenerative meniscus tear and some degenerative changes seen on MRI January of this year. Plan for repeat steroid injection today.  If not improved much neck step should probably be referral to orthopedic for surgical consultation. Exacerbation chronic problem.  Uncertain prognosis.   PDMP not reviewed this encounter. Orders Placed This Encounter  Procedures   [place] LIMITED JOINT SPACE STRUCTURES LOW LEFT(NO LINKED CHARGES)    Standing Status:   Future    Number of Occurrences:   1    Standing Expiration Date:   06/03/2021    Order Specific Question:   Reason for Exam (SYMPTOM  OR DIAGNOSIS REQUIRED)    Answer:   left knee pain    Order Specific Question:   Preferred imaging location?    Answer:   [place]   No orders of the defined types were placed in this encounter.    Discussed warning signs or symptoms. Please see discharge instructions. Patient expresses understanding.   The above documentation has been reviewed and is accurate and complete Lynne Leader, M.D.

## 2020-12-03 NOTE — Patient Instructions (Signed)
Thank you for coming in today.   You received an injection today. Seek immediate medical attention if the joint becomes red, extremely painful, or is oozing fluid.   Let me know how you feel after the steroid injection. If not better I will referred to orthopedic surgery.

## 2020-12-14 ENCOUNTER — Other Ambulatory Visit: Payer: Self-pay | Admitting: Physical Therapy

## 2020-12-14 ENCOUNTER — Telehealth: Payer: Self-pay | Admitting: *Deleted

## 2020-12-14 DIAGNOSIS — M170 Bilateral primary osteoarthritis of knee: Secondary | ICD-10-CM

## 2020-12-14 DIAGNOSIS — M25562 Pain in left knee: Secondary | ICD-10-CM

## 2020-12-14 NOTE — Telephone Encounter (Signed)
Pt called stating that after the knee injection on 10/20 he still does not feel any improvement and would like a referral to ortho.

## 2020-12-14 NOTE — Telephone Encounter (Signed)
Pt referred to [facility]  Their office will call pt to schedule.

## 2020-12-23 ENCOUNTER — Ambulatory Visit: Payer: 59 | Admitting: Medical-Surgical

## 2020-12-25 ENCOUNTER — Ambulatory Visit (INDEPENDENT_AMBULATORY_CARE_PROVIDER_SITE_OTHER): Payer: 59 | Admitting: Orthopaedic Surgery

## 2020-12-25 ENCOUNTER — Other Ambulatory Visit: Payer: Self-pay

## 2020-12-25 ENCOUNTER — Encounter: Payer: Self-pay | Admitting: Orthopaedic Surgery

## 2020-12-25 DIAGNOSIS — M25462 Effusion, left knee: Secondary | ICD-10-CM | POA: Diagnosis not present

## 2020-12-25 DIAGNOSIS — M25461 Effusion, right knee: Secondary | ICD-10-CM | POA: Diagnosis not present

## 2020-12-25 DIAGNOSIS — M1712 Unilateral primary osteoarthritis, left knee: Secondary | ICD-10-CM

## 2020-12-25 DIAGNOSIS — M25561 Pain in right knee: Secondary | ICD-10-CM

## 2020-12-25 DIAGNOSIS — M1711 Unilateral primary osteoarthritis, right knee: Secondary | ICD-10-CM | POA: Diagnosis not present

## 2020-12-25 DIAGNOSIS — M25562 Pain in left knee: Secondary | ICD-10-CM

## 2020-12-25 MED ORDER — METHYLPREDNISOLONE ACETATE 40 MG/ML IJ SUSP
40.0000 mg | INTRAMUSCULAR | Status: AC | PRN
Start: 2020-12-25 — End: 2020-12-25
  Administered 2020-12-25: 40 mg via INTRA_ARTICULAR

## 2020-12-25 MED ORDER — DICLOFENAC SODIUM 75 MG PO TBEC: 75.0000 mg | DELAYED_RELEASE_TABLET | Freq: Two times a day (BID) | ORAL | 3 refills | Status: DC

## 2020-12-25 MED ORDER — LIDOCAINE HCL 1 % IJ SOLN
2.0000 mL | INTRAMUSCULAR | Status: AC | PRN
  Administered 2020-12-25: 2 mL

## 2020-12-25 MED ORDER — BUPIVACAINE HCL 0.5 % IJ SOLN
2.0000 mL | INTRAMUSCULAR | Status: AC | PRN
  Administered 2020-12-25: 2 mL via INTRA_ARTICULAR

## 2020-12-25 NOTE — Progress Notes (Signed)
Office Visit Note   Patient: Richard Sexton           Date of Birth: [birth year]           MRN: [id number] Visit Date: 12/25/2020              Requested by: Gregor Hams, MD [place],  [place] [address] PCP: Samuel Bouche, NP   Assessment & Plan: Visit Diagnoses:  1. Primary osteoarthritis of right knee   2. Primary osteoarthritis of left knee   3. Bilateral knee swelling     Plan: Based on findings right knee symptoms consistent with OA exacerbation with effusion.  I drew 35 cc of synovial fluid from the knee and cortisone was injected.  For the left knee he is describing symptoms more consistent with OA then lateral meniscus tear.  We will refill his diclofenac today.  He will follow-up with [place] in the near future for evaluation of right shoulder pain.  Follow-Up Instructions: No follow-ups on file.   Orders:  No orders of the defined types were placed in this encounter.  Meds ordered this encounter  Medications   diclofenac (VOLTAREN) 75 MG EC tablet    Sig: Take 1 tablet (75 mg total) by mouth 2 (two) times daily.    Dispense:  60 tablet    Refill:  3      Procedures: Large Joint Inj: R knee on 12/25/2020 11:00 AM Indications: pain Details: 22 G needle  Arthrogram: No  Medications: 40 mg methylPREDNISolone acetate 40 MG/ML; 2 mL lidocaine 1 %; 2 mL bupivacaine 0.5 % Consent was given by the patient. Patient was prepped and draped in the usual sterile fashion.      Clinical Data: No additional findings.   Subjective: Chief Complaint  Patient presents with   Right Knee - Pain   Left Knee - Pain    Richard Sexton is a very pleasant 55 year old gentleman referral from Dr. Georgina Snell for bilateral knee pain.  In regards to the right knee he has felt a recent worsening in pain and swelling since he got back from a trip.  He felt stiffness and pain after he got off the airplane.  Denies any mechanical symptoms.  He does feel that there is subjective swelling.  He  has slightly decreased range of motion as a result.  Denies any numbness and tingling.  In regards to the left knee he did go to cortisone injection 3 weeks ago at Dr. Clovis Riley office.  He has felt some relief from this.  He describes symptoms of aching and stiffness with immobility and not describing mechanical symptoms.  He had an MRI earlier this year which showed a complex tear of the anterior horn the lateral meniscus.   Review of Systems  Constitutional: Negative.   All other systems reviewed and are negative.   Objective: Vital Signs: There were no vitals taken for this visit.  Physical Exam Vitals and nursing note reviewed.  Constitutional:      Appearance: He is well-developed.  Pulmonary:     Effort: Pulmonary effort is normal.  Abdominal:     Palpations: Abdomen is soft.  Skin:    General: Skin is warm.  Neurological:     Mental Status: He is alert and oriented to person, place, and time.  Psychiatric:        Behavior: Behavior normal.        Thought Content: Thought content normal.  Judgment: Judgment normal.    Ortho Exam  Right knee shows a moderate joint effusion.  Slight decreased flexion secondary to the effusion.  Collaterals and cruciates are stable.  No joint line tenderness.  1+ patellofemoral crepitus through arc of motion.  Left knee shows no significant lateral joint line tenderness.  Negative McMurray.  Normal range of motion.  1+ crepitus with range of motion.  Specialty Comments:  No specialty comments available.  Imaging: No results found.   PMFS History: Patient Active Problem List   Diagnosis Date Noted   Bilateral knee swelling 02/04/2020   S4 (fourth heart sound) 10/25/2019   Abnormal finding on EKG 10/25/2019   Essential hypertension 11/19/2017   Broken leg    ONYCHOMYCOSIS, TOENAILS 04/28/2009   SMOKER 04/28/2009   COCAINE ABUSE, HX OF 04/28/2009   EPIDERMOID CYST 02/16/2009   Past Medical History:  Diagnosis Date    Allergy    OTC meds PRN    Broken leg 1975   no surgery ,PT was casted - LT leg   ELEVATED BLOOD PRESSURE WITHOUT DIAGNOSIS OF HYPERTENSION 02/16/2009   Qualifier: Diagnosis of  By: Jorene Minors, Scott     GERD (gastroesophageal reflux disease)    occ   Hx of adenomatous polyp of colon 06/2019   Hypertension    Mass of head    right forehead    Family History  Problem Relation Age of Onset   Diabetes Mother    Hypertension Mother    Colon cancer Neg Hx    Colon polyps Neg Hx    Esophageal cancer Neg Hx    Rectal cancer Neg Hx    Stomach cancer Neg Hx     Past Surgical History:  Procedure Laterality Date   CARPAL TUNNEL RELEASE Bilateral    COLONOSCOPY W/ POLYPECTOMY  06/2019   diminutive adenoma recall 2029   LEG SURGERY Right 1986   Femur shorten   PVC ABLATION N/A 01/31/2020   Procedure: PVC ABLATION;  Surgeon: Vickie Epley, MD;  Location: [facility];  Service: Cardiovascular;  Laterality: N/A;   Social History   Occupational History   Occupation: cook  Tobacco Use   Smoking status: Every Day    Packs/day: 0.75    Years: 8.00    Pack years: 6.00    Types: Cigarettes   Smokeless tobacco: Never  Vaping Use   Vaping Use: Never used  Substance and Sexual Activity   Alcohol use: Yes    Comment: 1-2 drinks/week, beer or liquor   Drug use: No   Sexual activity: Yes    Partners: Female

## 2020-12-26 LAB — SYNOVIAL FLUID ANALYSIS, COMPLETE
Basophils, %: 0 %
Eosinophils-Synovial: 0 % (ref 0–2)
Lymphocytes-Synovial Fld: 82 % — ABNORMAL HIGH (ref 0–74)
Monocyte/Macrophage: 3 % (ref 0–69)
Neutrophil, Synovial: 9 % (ref 0–24)
Synoviocytes, %: 6 % (ref 0–15)
WBC, Synovial: 308 cells/uL — ABNORMAL HIGH (ref <150)

## 2020-12-26 LAB — TIQ-NTM

## 2020-12-28 ENCOUNTER — Telehealth: Payer: 59 | Admitting: Family Medicine

## 2021-01-21 ENCOUNTER — Ambulatory Visit: Payer: 59 | Admitting: Orthopaedic Surgery

## 2021-01-21 ENCOUNTER — Ambulatory Visit: Payer: 59 | Admitting: Medical-Surgical

## 2021-01-26 ENCOUNTER — Ambulatory Visit (INDEPENDENT_AMBULATORY_CARE_PROVIDER_SITE_OTHER): Payer: 59 | Admitting: Orthopaedic Surgery

## 2021-01-26 ENCOUNTER — Other Ambulatory Visit: Payer: Self-pay

## 2021-01-26 ENCOUNTER — Encounter: Payer: Self-pay | Admitting: Orthopaedic Surgery

## 2021-01-26 ENCOUNTER — Ambulatory Visit: Payer: Self-pay

## 2021-01-26 DIAGNOSIS — M25511 Pain in right shoulder: Secondary | ICD-10-CM

## 2021-01-26 DIAGNOSIS — G8929 Other chronic pain: Secondary | ICD-10-CM | POA: Diagnosis not present

## 2021-01-26 MED ORDER — METHYLPREDNISOLONE ACETATE 40 MG/ML IJ SUSP
40.0000 mg | INTRAMUSCULAR | Status: AC | PRN
Start: 2021-01-26 — End: 2021-01-26
  Administered 2021-01-26: 40 mg via INTRA_ARTICULAR

## 2021-01-26 MED ORDER — BUPIVACAINE HCL 0.5 % IJ SOLN
3.0000 mL | INTRAMUSCULAR | Status: AC | PRN
  Administered 2021-01-26: 3 mL via INTRA_ARTICULAR

## 2021-01-26 MED ORDER — LIDOCAINE HCL 1 % IJ SOLN
3.0000 mL | INTRAMUSCULAR | Status: AC | PRN
  Administered 2021-01-26: 3 mL

## 2021-01-26 NOTE — Progress Notes (Signed)
Office Visit Note   Patient: Richard Sexton           Date of Birth: [birth year]           MRN: [id number] Visit Date: 01/26/2021              Requested by: Samuel Bouche, NP [place],  [place] [address] PCP: Samuel Bouche, NP   Assessment & Plan: Visit Diagnoses:  1. Chronic right shoulder pain     Plan: Impression is chronic right shoulder pain.  Treatment options were discussed to include cortisone injection and activity modification prescription NSAIDs and rest for 6 weeks.  Have based on his options he would like to do a subacromial injection today.  He will take it easy for the next 6 weeks.  Follow-up as needed.  Follow-Up Instructions: No follow-ups on file.   Orders:  Orders Placed This Encounter  Procedures   XR Shoulder Right   No orders of the defined types were placed in this encounter.     Procedures: Large Joint Inj: R subacromial bursa on 01/26/2021 4:16 PM Indications: pain Details: 22 G needle  Arthrogram: No  Medications: 3 mL lidocaine 1 %; 3 mL bupivacaine 0.5 %; 40 mg methylPREDNISolone acetate 40 MG/ML Outcome: tolerated well, no immediate complications Consent was given by the patient. Patient was prepped and draped in the usual sterile fashion.      Clinical Data: No additional findings.   Subjective: Chief Complaint  Patient presents with   Right Shoulder - Pain    HPI  Richard Sexton comes in today for 2 be evaluated for chronic right shoulder pain that is worse over the last 3 months.  He feels that he may have injured it years ago on the job.  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  Denies any neck pain.    Review of Systems  Constitutional: Negative.   All other systems reviewed and are negative.   Objective: Vital Signs: There were no vitals taken for this visit.  Physical Exam Vitals and nursing note reviewed.  Constitutional:      Appearance: He  is well-developed.  Pulmonary:     Effort: Pulmonary effort is normal.  Abdominal:     Palpations: Abdomen is soft.  Skin:    General: Skin is warm.  Neurological:     Mental Status: He is alert and oriented to person, place, and time.  Psychiatric:        Behavior: Behavior normal.        Thought Content: Thought content normal.        Judgment: Judgment normal.    Ortho Exam  Examination of the shoulder girdle shows normal range of motion.  AC joint is not significantly tender.  Negative cross body negative empty can negative Hawkins.  Only real finding is moderate pain and weakness with isolated testing of the infraspinatus.  Specialty Comments:  No specialty comments available.  Imaging: XR Shoulder Right  Result Date: 01/26/2021 Mild irregularity of the greater tuberosity.  Moderate to severe degenerative AC joint.  Type 2-3 acromion.     PMFS History: Patient Active Problem List   Diagnosis Date Noted   Bilateral knee swelling 02/04/2020   S4 (fourth heart sound) 10/25/2019   Abnormal finding on EKG 10/25/2019   Essential hypertension 11/19/2017   Broken leg    ONYCHOMYCOSIS, TOENAILS 04/28/2009   SMOKER 04/28/2009  COCAINE ABUSE, HX OF 04/28/2009   EPIDERMOID CYST 02/16/2009   Past Medical History:  Diagnosis Date   Allergy    OTC meds PRN    Broken leg 1975   no surgery ,PT was casted - LT leg   ELEVATED BLOOD PRESSURE WITHOUT DIAGNOSIS OF HYPERTENSION 02/16/2009   Qualifier: Diagnosis of  By: Jorene Minors, Scott     GERD (gastroesophageal reflux disease)    occ   Hx of adenomatous polyp of colon 06/2019   Hypertension    Mass of head    right forehead    Family History  Problem Relation Age of Onset   Diabetes Mother    Hypertension Mother    Colon cancer Neg Hx    Colon polyps Neg Hx    Esophageal cancer Neg Hx    Rectal cancer Neg Hx    Stomach cancer Neg Hx     Past Surgical History:  Procedure Laterality Date   CARPAL TUNNEL RELEASE  Bilateral    COLONOSCOPY W/ POLYPECTOMY  06/2019   diminutive adenoma recall 2029   LEG SURGERY Right 1986   Femur shorten   PVC ABLATION N/A 01/31/2020   Procedure: PVC ABLATION;  Surgeon: Vickie Epley, MD;  Location: [facility];  Service: Cardiovascular;  Laterality: N/A;   Social History   Occupational History   Occupation: cook  Tobacco Use   Smoking status: Every Day    Packs/day: 0.75    Years: 8.00    Pack years: 6.00    Types: Cigarettes   Smokeless tobacco: Never  Vaping Use   Vaping Use: Never used  Substance and Sexual Activity   Alcohol use: Yes    Comment: 1-2 drinks/week, beer or liquor   Drug use: No   Sexual activity: Yes    Partners: Female

## 2021-02-11 ENCOUNTER — Telehealth: Payer: Self-pay | Admitting: Orthopaedic Surgery

## 2021-02-11 NOTE — Telephone Encounter (Signed)
Pts friend Cecille Rubin called stating the pt fell yesterday and hit his L knee and has been in severe pain since. Pt would like to know if he can be worked in with another provider since Dr.Xu isn't here for a cortisone inj? If not he would like to have something for pain called in. Pt would like a CB to be advised which option would be best for his situation.   [phone]

## 2021-02-12 ENCOUNTER — Encounter: Payer: Self-pay | Admitting: Physician Assistant

## 2021-02-12 ENCOUNTER — Ambulatory Visit (INDEPENDENT_AMBULATORY_CARE_PROVIDER_SITE_OTHER): Payer: 59 | Admitting: Physician Assistant

## 2021-02-12 ENCOUNTER — Other Ambulatory Visit: Payer: Self-pay

## 2021-02-12 DIAGNOSIS — M1712 Unilateral primary osteoarthritis, left knee: Secondary | ICD-10-CM | POA: Diagnosis not present

## 2021-02-12 DIAGNOSIS — M1711 Unilateral primary osteoarthritis, right knee: Secondary | ICD-10-CM

## 2021-02-12 MED ORDER — METHYLPREDNISOLONE ACETATE 40 MG/ML IJ SUSP
80.0000 mg | INTRAMUSCULAR | Status: AC | PRN
Start: 2021-02-12 — End: 2021-02-12
  Administered 2021-02-12: 10:00:00 80 mg via INTRA_ARTICULAR

## 2021-02-12 MED ORDER — LIDOCAINE HCL 1 % IJ SOLN
5.0000 mL | INTRAMUSCULAR | Status: AC | PRN
  Administered 2021-02-12: 10:00:00 5 mL

## 2021-02-12 NOTE — Progress Notes (Signed)
Office Visit Note   Patient: Richard Sexton           Date of Birth: 05-Jul-1965           MRN: [id number] Visit Date: 02/12/2021              Requested by: Samuel Bouche, NP [address] [place],  [place] [address] PCP: Samuel Bouche, NP  Chief Complaint  Patient presents with   Left Knee - Pain      HPI: Patient is a pleasant 55 year old gentleman with a history of bilateral knee arthritis.  He was at work and lifting heavy object twisted his knee ever so slightly.  He now has pain in his left knee.  He has had this similar pain before and done well with steroid injections.  He is simply asking to have his leg injected today.  He denies any instability he denies any locking catching  Assessment & Plan: Visit Diagnoses: No diagnosis found.  Plan: Patient with previous history of arthritis in his knees has done well with injections and requesting an injection into his left knee today.  We will go forward with this.  Follow-up if this does not improve  Follow-Up Instructions: No follow-ups on file.   Ortho Exam  Patient is alert, oriented, no adenopathy, well-dressed, normal affect, normal respiratory effort. Examination of his left knee he has no effusion.  Extensor mechanism the quadriceps and patellar tendon are intact.  No effusion no warmth no cellulitis.  He does have some tenderness over the medial lateral joint line good endpoint good varus valgus stability  Imaging: No results found. No images are attached to the encounter.  Labs: Lab Results  Component Value Date   HGBA1C 5.6 11/03/2017   REPTSTATUS 11/11/2008 FINAL 11/09/2008   CULT ESCHERICHIA COLI 11/09/2008   LABORGA ESCHERICHIA COLI 11/09/2008     Lab Results  Component Value Date   ALBUMIN 4.2 11/03/2017   ALBUMIN 4.7 04/28/2009   ALBUMIN 4.4 02/16/2009    No results found for: MG No results found for: VD25OH  No results found for: PREALBUMIN CBC EXTENDED Latest Ref Rng & Units  10/27/2020 01/29/2020 11/04/2019  WBC 3.8 - 10.8 Thousand/uL 5.9 5.3 5.4  RBC 4.20 - 5.80 Million/uL 5.03 5.14 5.27  HGB 13.2 - 17.1 g/dL 14.7 14.8 15.2  HCT 38.5 - 50.0 % 45.3 43.3 46.3  PLT 140 - 400 Thousand/uL 148 147(L) 147  NEUTROABS 1,500 - 7,800 cells/uL 3,103 2.6 -  LYMPHSABS 850 - 3,900 cells/uL 1,918 1.7 -     There is no height or weight on file to calculate BMI.  Orders:  No orders of the defined types were placed in this encounter.  No orders of the defined types were placed in this encounter.    Procedures: Large Joint Inj on 02/12/2021 10:01 AM Indications: pain and diagnostic evaluation Details: 25 G 1.5 in needle, anterolateral approach  Arthrogram: No  Medications: 80 mg methylPREDNISolone acetate 40 MG/ML; 5 mL lidocaine 1 % Outcome: tolerated well, no immediate complications Procedure, treatment alternatives, risks and benefits explained, specific risks discussed. Consent was given by the patient.     Clinical Data: No additional findings.  ROS:  All other systems negative, except as noted in the HPI. Review of Systems  Objective: Vital Signs: There were no vitals taken for this visit.  Specialty Comments:  No specialty comments available.  PMFS History: Patient Active Problem List   Diagnosis Date Noted  Bilateral knee swelling 02/04/2020   S4 (fourth heart sound) 10/25/2019   Abnormal finding on EKG 10/25/2019   Essential hypertension 11/19/2017   Broken leg    ONYCHOMYCOSIS, TOENAILS 04/28/2009   SMOKER 04/28/2009   COCAINE ABUSE, HX OF 04/28/2009   EPIDERMOID CYST 02/16/2009   Past Medical History:  Diagnosis Date   Allergy    OTC meds PRN    Broken leg 1975   no surgery ,PT was casted - LT leg   ELEVATED BLOOD PRESSURE WITHOUT DIAGNOSIS OF HYPERTENSION 02/16/2009   Qualifier: Diagnosis of  By: Jorene Minors, Scott     GERD (gastroesophageal reflux disease)    occ   Hx of adenomatous polyp of colon 06/2019   Hypertension     Mass of head    right forehead    Family History  Problem Relation Age of Onset   Diabetes Mother    Hypertension Mother    Colon cancer Neg Hx    Colon polyps Neg Hx    Esophageal cancer Neg Hx    Rectal cancer Neg Hx    Stomach cancer Neg Hx     Past Surgical History:  Procedure Laterality Date   CARPAL TUNNEL RELEASE Bilateral    COLONOSCOPY W/ POLYPECTOMY  06/2019   diminutive adenoma recall 2029   LEG SURGERY Right 1986   Femur shorten   PVC ABLATION N/A 01/31/2020   Procedure: PVC ABLATION;  Surgeon: Vickie Epley, MD;  Location: [facility];  Service: Cardiovascular;  Laterality: N/A;   Social History   Occupational History   Occupation: cook  Tobacco Use   Smoking status: Every Day    Packs/day: 0.75    Years: 8.00    Pack years: 6.00    Types: Cigarettes   Smokeless tobacco: Never  Vaping Use   Vaping Use: Never used  Substance and Sexual Activity   Alcohol use: Yes    Comment: 1-2 drinks/week, beer or liquor   Drug use: No   Sexual activity: Yes    Partners: Female

## 2021-02-16 ENCOUNTER — Ambulatory Visit: Payer: 59 | Admitting: Medical-Surgical

## 2021-03-19 ENCOUNTER — Ambulatory Visit: Payer: 59 | Admitting: Physician Assistant

## 2021-03-22 ENCOUNTER — Other Ambulatory Visit: Payer: Self-pay

## 2021-03-22 ENCOUNTER — Ambulatory Visit (INDEPENDENT_AMBULATORY_CARE_PROVIDER_SITE_OTHER): Payer: 59 | Admitting: Physician Assistant

## 2021-03-22 ENCOUNTER — Ambulatory Visit: Payer: 59 | Admitting: Physician Assistant

## 2021-03-22 ENCOUNTER — Encounter: Payer: Self-pay | Admitting: Physician Assistant

## 2021-03-22 ENCOUNTER — Ambulatory Visit: Payer: Self-pay

## 2021-03-22 DIAGNOSIS — M5442 Lumbago with sciatica, left side: Secondary | ICD-10-CM

## 2021-03-22 DIAGNOSIS — M5441 Lumbago with sciatica, right side: Secondary | ICD-10-CM | POA: Diagnosis not present

## 2021-03-22 MED ORDER — METHYLPREDNISOLONE 4 MG PO TBPK: ORAL_TABLET | ORAL | 0 refills | Status: DC

## 2021-03-22 NOTE — Progress Notes (Signed)
Office Visit Note   Patient: Richard Sexton           Date of Birth: 1965/10/30           MRN: [id number] Visit Date: 03/22/2021              Requested by: Samuel Bouche, NP [address] [place],  [place] [address] PCP: Samuel Bouche, NP  Chief Complaint  Patient presents with   Right Leg - Pain   Left Leg - Pain      HPI: Patient is a pleasant 56 year old gentleman who I last saw with knee pain.  I did give him an injection into the right knee.  He says this really did not help him much.  He is now complaining of pain radiating down his legs into the back of his knees.  He also has tingling and numbness that goes into his front of his leg sometime.  Notices pain at night and also when switching positions from sitting to standing denies any loss of bowel or bladder control denies any giving way of his legs  Assessment & Plan: Visit Diagnoses:  1. Acute bilateral low back pain with bilateral sciatica     Plan: Findings consistent with sciatica.  Talked about the natural history of this.  We will start him on a course of Depo-Medrol and see how he does.  Follow-up in 2 weeks.  If he did not get better we could consider an MRI for possible epidural steroid injection.  Discussed with him the side effects of steroids and that he should take this with food.  Follow-Up Instructions: No follow-ups on file.   Ortho Exam  Patient is alert, oriented, no adenopathy, well-dressed, normal affect, normal respiratory effort. Examination he has no palpable defect in his spine.  His pain is reproduced in his legs with extension of his back not so much with forward bending or side to side bending.  Deep tendon reflexes are intact.  He has good strength with dorsiflexion plantarflexion of his ankles resisted extension and flexion of his legs.  Good strength with hip flexion.  He has no pain with rotation of his right and left hip  Imaging: XR Lumbar Spine 2-3 Views  Result Date:  03/22/2021 2 view radiographs of his lumbar spine demonstrate no listhesis overall well-maintained alignment.  He does have some endpoint osteophytes and some slight narrowing at L5-S1 L4-5 no acute osseous injuries  No images are attached to the encounter.  Labs: Lab Results  Component Value Date   HGBA1C 5.6 11/03/2017   REPTSTATUS 11/11/2008 FINAL 11/09/2008   CULT ESCHERICHIA COLI 11/09/2008   LABORGA ESCHERICHIA COLI 11/09/2008     Lab Results  Component Value Date   ALBUMIN 4.2 11/03/2017   ALBUMIN 4.7 04/28/2009   ALBUMIN 4.4 02/16/2009    No results found for: MG No results found for: VD25OH  No results found for: PREALBUMIN CBC EXTENDED Latest Ref Rng & Units 10/27/2020 01/29/2020 11/04/2019  WBC 3.8 - 10.8 Thousand/uL 5.9 5.3 5.4  RBC 4.20 - 5.80 Million/uL 5.03 5.14 5.27  HGB 13.2 - 17.1 g/dL 14.7 14.8 15.2  HCT 38.5 - 50.0 % 45.3 43.3 46.3  PLT 140 - 400 Thousand/uL 148 147(L) 147  NEUTROABS 1,500 - 7,800 cells/uL 3,103 2.6 -  LYMPHSABS 850 - 3,900 cells/uL 1,918 1.7 -     There is no height or weight on file to calculate BMI.  Orders:  Orders Placed This Encounter  Procedures   XR Lumbar Spine 2-3 Views   No orders of the defined types were placed in this encounter.    Procedures: No procedures performed  Clinical Data: No additional findings.  ROS:  All other systems negative, except as noted in the HPI. Review of Systems  Objective: Vital Signs: There were no vitals taken for this visit.  Specialty Comments:  No specialty comments available.  PMFS History: Patient Active Problem List   Diagnosis Date Noted   Bilateral knee swelling 02/04/2020   S4 (fourth heart sound) 10/25/2019   Abnormal finding on EKG 10/25/2019   Essential hypertension 11/19/2017   Broken leg    ONYCHOMYCOSIS, TOENAILS 04/28/2009   SMOKER 04/28/2009   COCAINE ABUSE, HX OF 04/28/2009   EPIDERMOID CYST 02/16/2009   Past Medical History:  Diagnosis Date    Allergy    OTC meds PRN    Broken leg 1975   no surgery ,PT was casted - LT leg   ELEVATED BLOOD PRESSURE WITHOUT DIAGNOSIS OF HYPERTENSION 02/16/2009   Qualifier: Diagnosis of  By: Jorene Minors, Scott     GERD (gastroesophageal reflux disease)    occ   Hx of adenomatous polyp of colon 06/2019   Hypertension    Mass of head    right forehead    Family History  Problem Relation Age of Onset   Diabetes Mother    Hypertension Mother    Colon cancer Neg Hx    Colon polyps Neg Hx    Esophageal cancer Neg Hx    Rectal cancer Neg Hx    Stomach cancer Neg Hx     Past Surgical History:  Procedure Laterality Date   CARPAL TUNNEL RELEASE Bilateral    COLONOSCOPY W/ POLYPECTOMY  06/2019   diminutive adenoma recall 2029   LEG SURGERY Right 1986   Femur shorten   PVC ABLATION N/A 01/31/2020   Procedure: PVC ABLATION;  Surgeon: Vickie Epley, MD;  Location: [facility];  Service: Cardiovascular;  Laterality: N/A;   Social History   Occupational History   Occupation: cook  Tobacco Use   Smoking status: Every Day    Packs/day: 0.75    Years: 8.00    Pack years: 6.00    Types: Cigarettes   Smokeless tobacco: Never  Vaping Use   Vaping Use: Never used  Substance and Sexual Activity   Alcohol use: Yes    Comment: 1-2 drinks/week, beer or liquor   Drug use: No   Sexual activity: Yes    Partners: Female

## 2021-03-23 ENCOUNTER — Ambulatory Visit: Payer: 59 | Admitting: Physician Assistant

## 2021-04-03 ENCOUNTER — Other Ambulatory Visit: Payer: Self-pay | Admitting: Medical-Surgical

## 2021-04-03 DIAGNOSIS — I10 Essential (primary) hypertension: Secondary | ICD-10-CM

## 2021-05-10 ENCOUNTER — Other Ambulatory Visit: Payer: Self-pay | Admitting: Medical-Surgical

## 2021-05-10 DIAGNOSIS — I10 Essential (primary) hypertension: Secondary | ICD-10-CM

## 2021-05-10 NOTE — Telephone Encounter (Signed)
Patient scheduled for a follow up appointment.  ?

## 2021-05-20 ENCOUNTER — Encounter: Payer: Self-pay | Admitting: Physician Assistant

## 2021-05-20 ENCOUNTER — Ambulatory Visit (INDEPENDENT_AMBULATORY_CARE_PROVIDER_SITE_OTHER): Payer: 59 | Admitting: Physician Assistant

## 2021-05-20 DIAGNOSIS — M170 Bilateral primary osteoarthritis of knee: Secondary | ICD-10-CM

## 2021-05-20 DIAGNOSIS — M1712 Unilateral primary osteoarthritis, left knee: Secondary | ICD-10-CM

## 2021-05-20 DIAGNOSIS — M1711 Unilateral primary osteoarthritis, right knee: Secondary | ICD-10-CM | POA: Diagnosis not present

## 2021-05-20 MED ORDER — METHYLPREDNISOLONE ACETATE 40 MG/ML IJ SUSP
80.0000 mg | INTRAMUSCULAR | Status: AC | PRN
  Administered 2021-05-20: 80 mg via INTRA_ARTICULAR

## 2021-05-20 MED ORDER — LIDOCAINE HCL 1 % IJ SOLN
5.0000 mL | INTRAMUSCULAR | Status: AC | PRN
  Administered 2021-05-20: 5 mL

## 2021-05-20 NOTE — Progress Notes (Signed)
? ?Office Visit Note ?  ?Patient: Richard Sexton           ?Date of Birth: [birth year]           ?MRN: [id number] ?Visit Date: 05/20/2021 ?             ?Requested by: Samuel Bouche, NP ?[address] ?[address] ?[place],  [place] [address] ?PCP: Samuel Bouche, NP ? ?Chief Complaint  ?Patient presents with  ?? Right Knee - Pain  ?? Left Knee - Pain  ? ? ? ? ?HPI: ?Patient is a 56 year old gentleman with a history of bilateral knee arthritis.  He was last injected in the right knee in December.  He denies any recent injury just activity and is complaining of pain in both the right and left knee.  No recent illness ? ?Assessment & Plan: ?Visit Diagnoses: Bilateral knee pain. ? ?Plan: He does have fluid in the right knee we will go forward with an aspiration and injection into the right knee and an injection into the left knee.  Patient will follow-up as needed ? ?Follow-Up Instructions: No follow-ups on file.  ? ?Ortho Exam ? ?Patient is alert, oriented, no adenopathy, well-dressed, normal affect, normal respiratory effort. ?Right knee he has no redness but does have a moderate effusion.  Global tenderness with range of motion no evidence of an infective process ?Left knee no effusion he has tenderness medial laterally no redness no sign of infection ? ?Imaging: ?No results found. ?No images are attached to the encounter. ? ?Labs: ?Lab Results  ?Component Value Date  ? HGBA1C 5.6 11/03/2017  ? REPTSTATUS 11/11/2008 FINAL 11/09/2008  ? CULT ESCHERICHIA COLI 11/09/2008  ? [place] 11/09/2008  ? ? ? ?Lab Results  ?Component Value Date  ? ALBUMIN 4.2 11/03/2017  ? ALBUMIN 4.7 04/28/2009  ? ALBUMIN 4.4 02/16/2009  ? ? ?No results found for: MG ?No results found for: VD25OH ? ?No results found for: PREALBUMIN ? ?  Latest Ref Rng & Units 10/27/2020  ? 12:00 AM 01/29/2020  ?  8:16 AM 11/04/2019  ?  7:20 AM  ?CBC EXTENDED  ?WBC 3.8 - 10.8 Thousand/uL 5.9   5.3   5.4    ?RBC 4.20 - 5.80 Million/uL 5.03   5.14   5.27     ?Hemoglobin 13.2 - 17.1 g/dL 14.7   14.8   15.2    ?HCT 38.5 - 50.0 % 45.3   43.3   46.3    ?Platelets 140 - 400 Thousand/uL 148   147   147    ?NEUT# 1,500 - 7,800 cells/uL 3,103   2.6     ?Lymph# 850 - 3,900 cells/uL 1,918   1.7     ? ? ? ?There is no height or weight on file to calculate BMI. ? ?Orders:  ?No orders of the defined types were placed in this encounter. ? ?No orders of the defined types were placed in this encounter. ? ? ? Procedures: ?Large Joint Inj: bilateral knee on 05/20/2021 3:47 PM ?Indications: pain and diagnostic evaluation ?Details: 25 G 1.5 in needle, superolateral approach ? ?Arthrogram: No ? ?Medications (Right): 5 mL lidocaine 1 %; 80 mg methylPREDNISolone acetate 40 MG/ML ?Aspirate (Right): 35 mL yellow ?Medications (Left): 5 mL lidocaine 1 %; 80 mg methylPREDNISolone acetate 40 MG/ML ?Outcome: tolerated well, no immediate complications ? ?After obtaining verbal consent patient was anesthetized with 5 cc of lidocaine plain.  On the superior lateral aspect of his right  knee prior to this the knee was prepped with alcohol and Betadine.  18-gauge needle was inserted and 35 cc of serous yellow fluid was aspirated.  80 mg of Depo-Medrol was injected.  On the left knee anterior medial aspect was prepped sterilely with Betadine and alcohol.  80 mg of methylprednisolone was injected.  Patient tolerated procedures well ?Procedure, treatment alternatives, risks and benefits explained, specific risks discussed. Consent was given by the patient.  ? ? ? ?Clinical Data: ?No additional findings. ? ?ROS: ? ?All other systems negative, except as noted in the HPI. ?Review of Systems ? ?Objective: ?Vital Signs: There were no vitals taken for this visit. ? ?Specialty Comments:  ?No specialty comments available. ? ?PMFS History: ?Patient Active Problem List  ? Diagnosis Date Noted  ?? Bilateral knee swelling 02/04/2020  ?? S4 (fourth heart sound) 10/25/2019  ?? Abnormal finding on EKG 10/25/2019  ??  Essential hypertension 11/19/2017  ?? Broken leg   ?? ONYCHOMYCOSIS, TOENAILS 04/28/2009  ?? SMOKER 04/28/2009  ?? COCAINE ABUSE, HX OF 04/28/2009  ?? EPIDERMOID CYST 02/16/2009  ? ?Past Medical History:  ?Diagnosis Date  ?? Allergy   ? OTC meds PRN   ?? Broken leg 1975  ? no surgery ,PT was casted - LT leg  ?? ELEVATED BLOOD PRESSURE WITHOUT DIAGNOSIS OF HYPERTENSION 02/16/2009  ? Qualifier: Diagnosis of  By: Jorene Minors, Scott    ?? GERD (gastroesophageal reflux disease)   ? occ  ?? Hx of adenomatous polyp of colon 06/2019  ?? Hypertension   ?? Mass of head   ? right forehead  ?  ?Family History  ?Problem Relation Age of Onset  ?? Diabetes Mother   ?? Hypertension Mother   ?? Colon cancer Neg Hx   ?? Colon polyps Neg Hx   ?? Esophageal cancer Neg Hx   ?? Rectal cancer Neg Hx   ?? Stomach cancer Neg Hx   ?  ?Past Surgical History:  ?Procedure Laterality Date  ?? CARPAL TUNNEL RELEASE Bilateral   ?? COLONOSCOPY W/ POLYPECTOMY  06/2019  ? diminutive adenoma recall 2029  ?? LEG SURGERY Right 1986  ? Femur shorten  ?? PVC ABLATION N/A 01/31/2020  ? Procedure: PVC ABLATION;  Surgeon: Vickie Epley, MD;  Location: [facility];  Service: Cardiovascular;  Laterality: N/A;  ? ?Social History  ? ?Occupational History  ?? Occupation: cook  ?Tobacco Use  ?? Smoking status: Every Day  ?  Packs/day: 0.75  ?  Years: 8.00  ?  Pack years: 6.00  ?  Types: Cigarettes  ?? Smokeless tobacco: Never  ?Vaping Use  ?? Vaping Use: Never used  ?Substance and Sexual Activity  ?? Alcohol use: Yes  ?  Comment: 1-2 drinks/week, beer or liquor  ?? Drug use: No  ?? Sexual activity: Yes  ?  Partners: Female  ? ? ? ? ? ?

## 2021-06-25 ENCOUNTER — Telehealth: Payer: Self-pay

## 2021-06-25 ENCOUNTER — Encounter: Payer: Self-pay | Admitting: Physician Assistant

## 2021-06-25 ENCOUNTER — Ambulatory Visit (INDEPENDENT_AMBULATORY_CARE_PROVIDER_SITE_OTHER): Payer: 59 | Admitting: Physician Assistant

## 2021-06-25 DIAGNOSIS — M1712 Unilateral primary osteoarthritis, left knee: Secondary | ICD-10-CM

## 2021-06-25 IMAGING — DX DG ABDOMEN 1V
2 series · 2 of 2 positions shown · non-contrast
Comparison: None.

CLINICAL DATA: Lower abdominal pain, bloating, chills, constipation

EXAM:
ABDOMEN - 1 VIEW

[abdomen kub (1 of 2)]
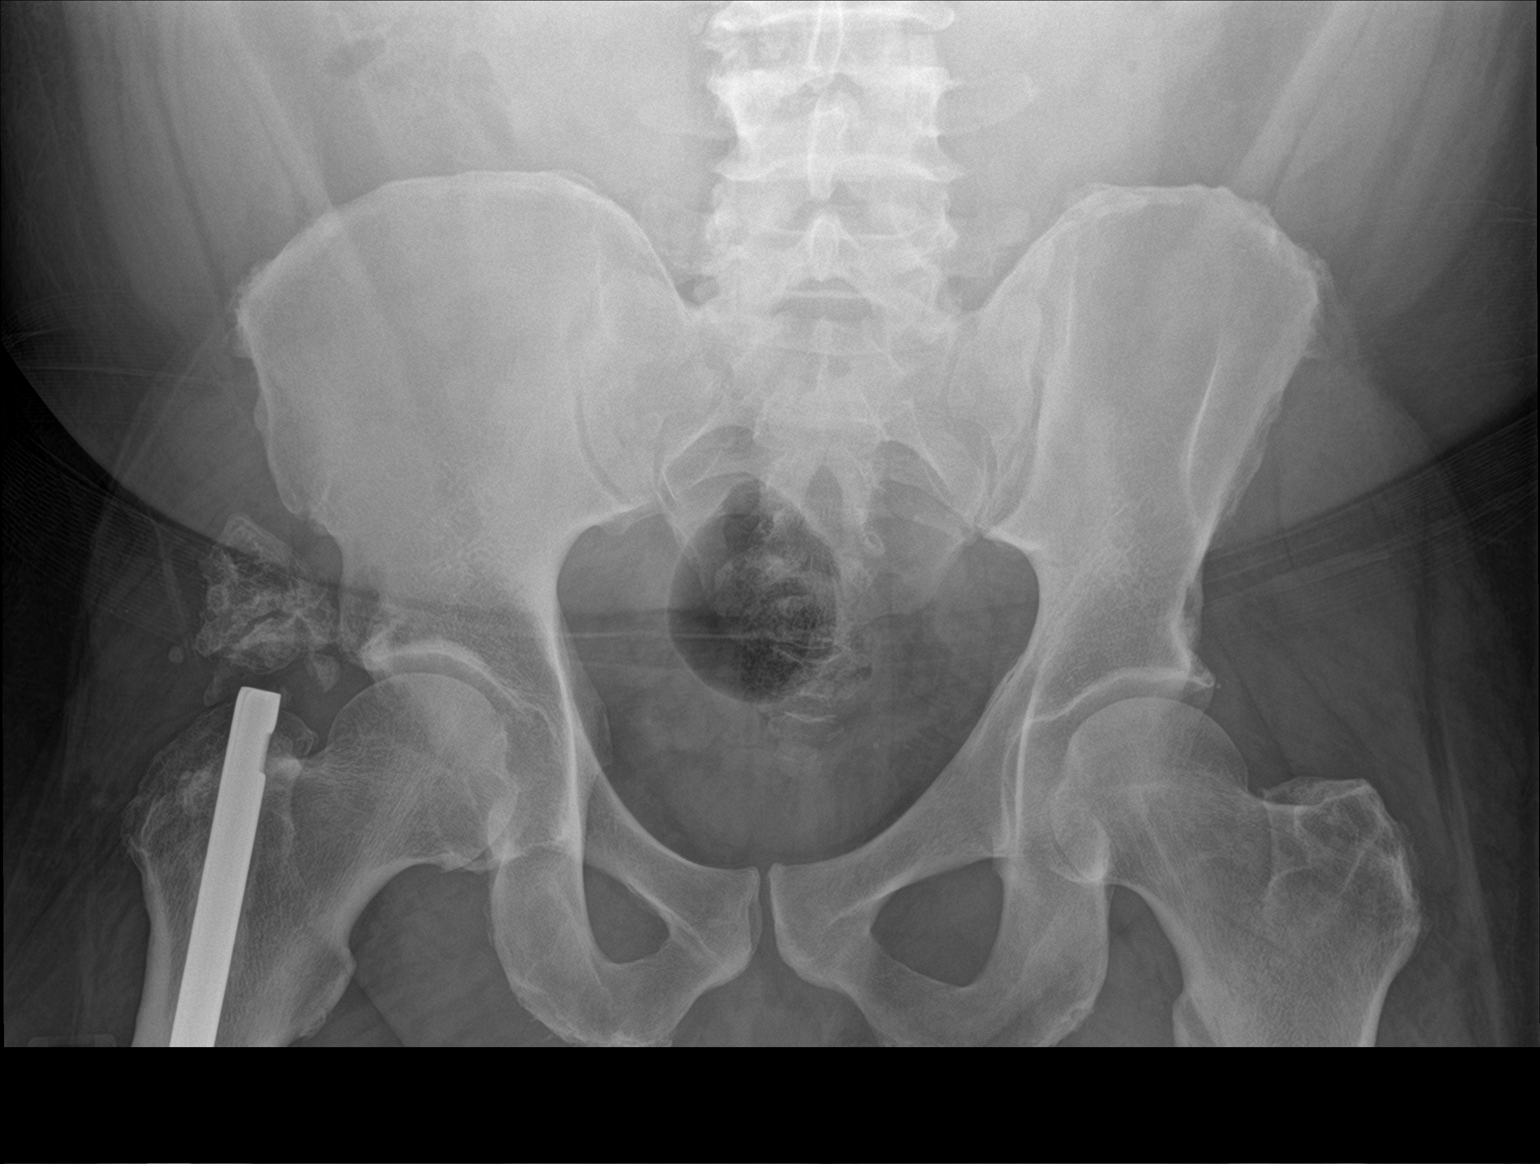

[abdomen kub (2 of 2)]
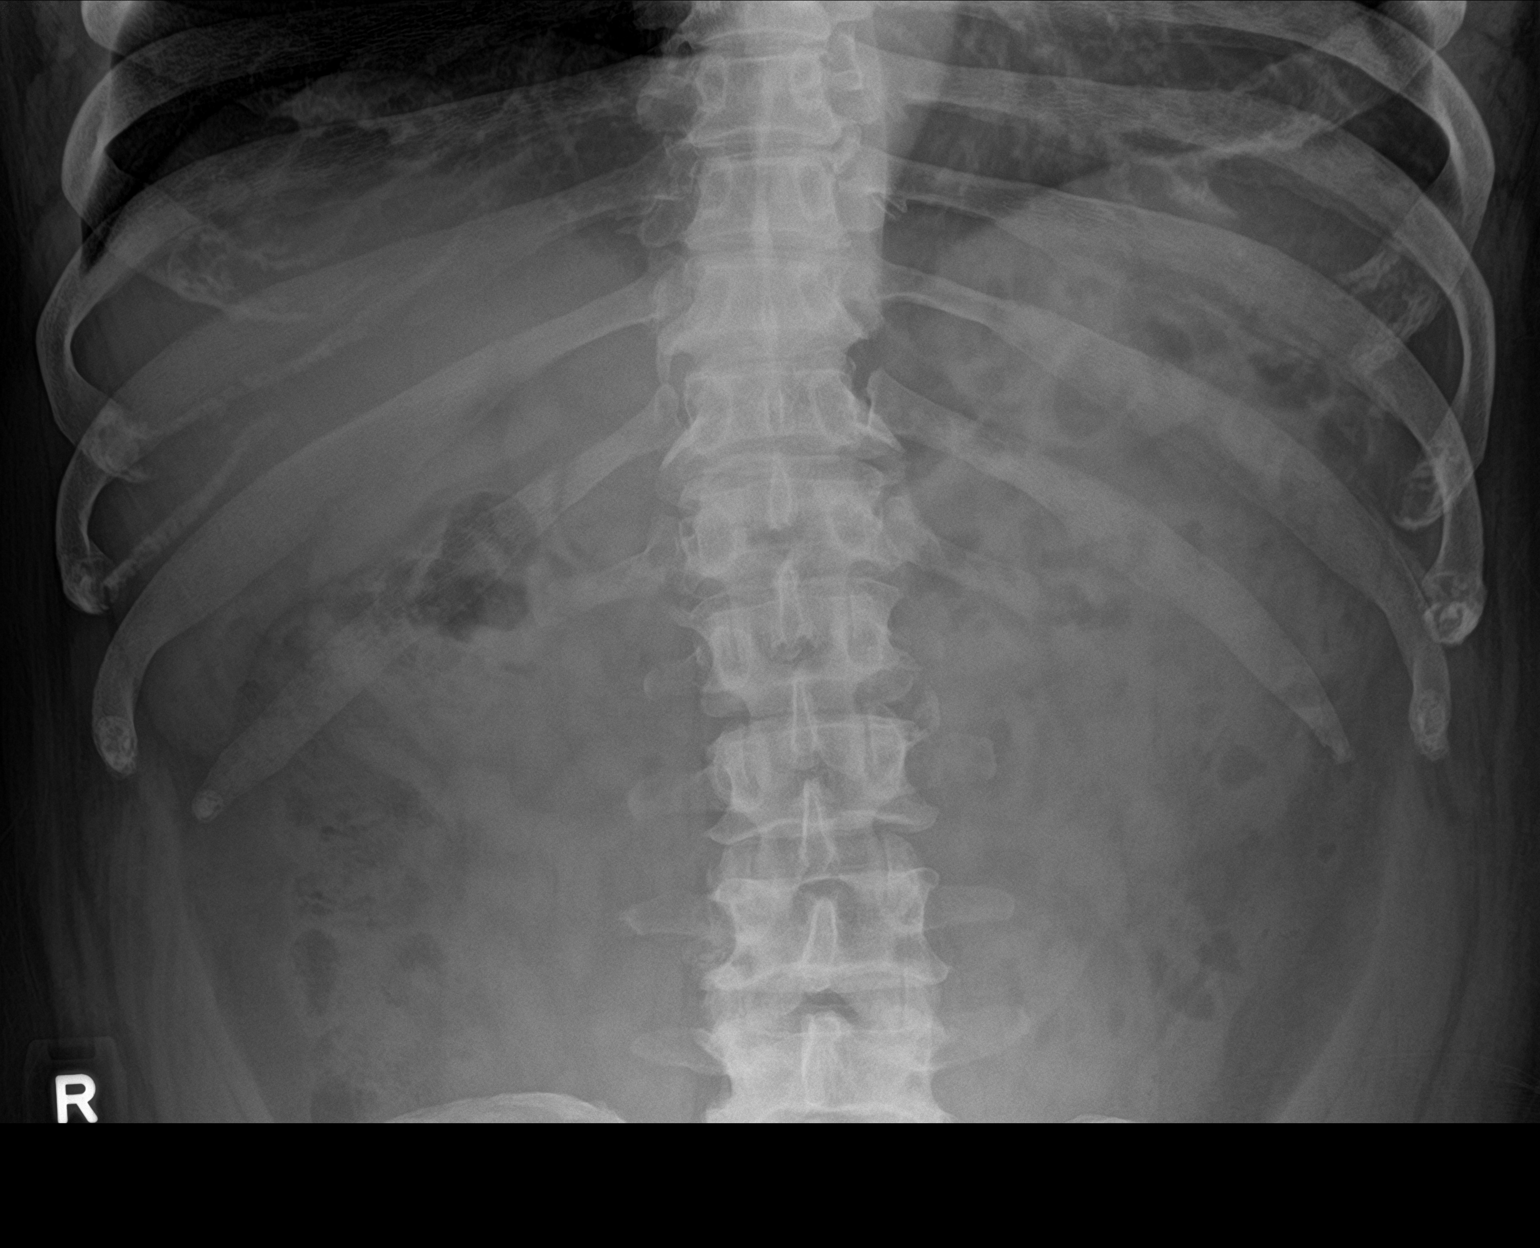

[2 of 2 positions shown; findings below may reference images not displayed]

FINDINGS: 2 supine frontal views of the abdomen and pelvis demonstrate an
unremarkable bowel gas pattern. No masses or abnormal
calcifications. Lung bases are clear. Postsurgical changes of the
right hip.
IMPRESSION: 1. Unremarkable bowel gas pattern.

## 2021-06-25 MED ORDER — METHYLPREDNISOLONE ACETATE 40 MG/ML IJ SUSP
80.0000 mg | INTRAMUSCULAR | Status: AC | PRN
  Administered 2021-06-25: 80 mg via INTRA_ARTICULAR

## 2021-06-25 MED ORDER — LIDOCAINE HCL 1 % IJ SOLN
5.0000 mL | INTRAMUSCULAR | Status: AC | PRN
  Administered 2021-06-25: 5 mL

## 2021-06-25 NOTE — Telephone Encounter (Signed)
Left knee gel injection ?

## 2021-06-25 NOTE — Progress Notes (Signed)
? ?Office Visit Note ?  ?Patient: Richard Sexton           ?Date of Birth: Nov 27, 1965           ?MRN: [id number] ?Visit Date: 06/25/2021 ?             ?Requested by: Samuel Bouche, NP ?[address] ?[address] ?[place],  [place] [address] ?PCP: Samuel Bouche, NP ? ?Chief Complaint  ?Patient presents with  ?? Left Knee - Pain  ? ? ? ? ?HPI: ?Patient is a pleasant 56 year old gentleman who 6 weeks ago I injected both of his knees with cortisone for bilateral knee arthritis.  Also aspirated the right knee.  The right knee is doing quite well and is not having much problems.  He comes back today because the pain is returned to his left knee.  He denies any fever chills or injuries. ? ?Assessment & Plan: ?Visit Diagnoses: Left knee pain ? ?Plan: I told him that I will go forward and inject the left knee today but certainly cannot do this for another 3 months.  We can also go forward and get authorization for viscosupplementation on his left knee ?This patient is diagnosed with osteoarthritis of the knee(s).   ? ?Radiographs show evidence of joint space narrowing, osteophytes, subchondral sclerosis and/or subchondral cysts.  This patient has knee pain which interferes with functional and activities of daily living.   ? ?This patient has experienced inadequate response, adverse effects and/or intolerance with conservative treatments such as acetaminophen, NSAIDS, topical creams, physical therapy or regular exercise, knee bracing and/or weight loss.  ? ?This patient has experienced inadequate response or has a contraindication to intra articular steroid injections for at least 3 months.  ? ?This patient is not scheduled to have a total knee replacement within 6 months of starting treatment with viscosupplementation. ? ? ?Follow-Up Instructions: No follow-ups on file.  ? ?Ortho Exam ? ?Patient is alert, oriented, no adenopathy, well-dressed, normal affect, normal respiratory effort. ?Examination of his left knee no effusion no  redness no cellulitis he does have crepitus with range of motion and tenderness over the medial lateral joint lines good stability ? ?Imaging: ?No results found. ?No images are attached to the encounter. ? ?Labs: ?Lab Results  ?Component Value Date  ? HGBA1C 5.6 11/03/2017  ? REPTSTATUS 11/11/2008 FINAL 11/09/2008  ? CULT ESCHERICHIA COLI 11/09/2008  ? [place] 11/09/2008  ? ? ? ?Lab Results  ?Component Value Date  ? ALBUMIN 4.2 11/03/2017  ? ALBUMIN 4.7 04/28/2009  ? ALBUMIN 4.4 02/16/2009  ? ? ?No results found for: MG ?No results found for: VD25OH ? ?No results found for: PREALBUMIN ? ?  Latest Ref Rng & Units 10/27/2020  ? 12:00 AM 01/29/2020  ?  8:16 AM 11/04/2019  ?  7:20 AM  ?CBC EXTENDED  ?WBC 3.8 - 10.8 Thousand/uL 5.9   5.3   5.4    ?RBC 4.20 - 5.80 Million/uL 5.03   5.14   5.27    ?Hemoglobin 13.2 - 17.1 g/dL 14.7   14.8   15.2    ?HCT 38.5 - 50.0 % 45.3   43.3   46.3    ?Platelets 140 - 400 Thousand/uL 148   147   147    ?NEUT# 1,500 - 7,800 cells/uL 3,103   2.6     ?Lymph# 850 - 3,900 cells/uL 1,918   1.7     ? ? ? ?There is no height or weight on  file to calculate BMI. ? ?Orders:  ?No orders of the defined types were placed in this encounter. ? ?No orders of the defined types were placed in this encounter. ? ? ? Procedures: ?Large Joint Inj: L knee on 06/25/2021 3:57 PM ?Indications: pain and diagnostic evaluation ?Details: 25 G 1.5 in needle, anteromedial approach ? ?Arthrogram: No ? ?Medications: 80 mg methylPREDNISolone acetate 40 MG/ML; 5 mL lidocaine 1 % ?Outcome: tolerated well, no immediate complications ?Procedure, treatment alternatives, risks and benefits explained, specific risks discussed. Consent was given by the patient.  ? ? ? ?Clinical Data: ?No additional findings. ? ?ROS: ? ?All other systems negative, except as noted in the HPI. ?Review of Systems ? ?Objective: ?Vital Signs: There were no vitals taken for this visit. ? ?Specialty Comments:  ?No specialty comments  available. ? ?PMFS History: ?Patient Active Problem List  ? Diagnosis Date Noted  ?? Bilateral knee swelling 02/04/2020  ?? S4 (fourth heart sound) 10/25/2019  ?? Abnormal finding on EKG 10/25/2019  ?? Essential hypertension 11/19/2017  ?? Broken leg   ?? ONYCHOMYCOSIS, TOENAILS 04/28/2009  ?? SMOKER 04/28/2009  ?? COCAINE ABUSE, HX OF 04/28/2009  ?? EPIDERMOID CYST 02/16/2009  ? ?Past Medical History:  ?Diagnosis Date  ?? Allergy   ? OTC meds PRN   ?? Broken leg 1975  ? no surgery ,PT was casted - LT leg  ?? ELEVATED BLOOD PRESSURE WITHOUT DIAGNOSIS OF HYPERTENSION 02/16/2009  ? Qualifier: Diagnosis of  By: Jorene Minors, Scott    ?? GERD (gastroesophageal reflux disease)   ? occ  ?? Hx of adenomatous polyp of colon 06/2019  ?? Hypertension   ?? Mass of head   ? right forehead  ?  ?Family History  ?Problem Relation Age of Onset  ?? Diabetes Mother   ?? Hypertension Mother   ?? Colon cancer Neg Hx   ?? Colon polyps Neg Hx   ?? Esophageal cancer Neg Hx   ?? Rectal cancer Neg Hx   ?? Stomach cancer Neg Hx   ?  ?Past Surgical History:  ?Procedure Laterality Date  ?? CARPAL TUNNEL RELEASE Bilateral   ?? COLONOSCOPY W/ POLYPECTOMY  06/2019  ? diminutive adenoma recall 2029  ?? LEG SURGERY Right 1986  ? Femur shorten  ?? PVC ABLATION N/A 01/31/2020  ? Procedure: PVC ABLATION;  Surgeon: Vickie Epley, MD;  Location: [facility];  Service: Cardiovascular;  Laterality: N/A;  ? ?Social History  ? ?Occupational History  ?? Occupation: cook  ?Tobacco Use  ?? Smoking status: Every Day  ?  Packs/day: 0.75  ?  Years: 8.00  ?  Pack years: 6.00  ?  Types: Cigarettes  ?? Smokeless tobacco: Never  ?Vaping Use  ?? Vaping Use: Never used  ?Substance and Sexual Activity  ?? Alcohol use: Yes  ?  Comment: 1-2 drinks/week, beer or liquor  ?? Drug use: No  ?? Sexual activity: Yes  ?  Partners: Female  ? ? ? ? ? ?

## 2021-06-25 NOTE — Telephone Encounter (Signed)
Noted  

## 2021-07-10 ENCOUNTER — Other Ambulatory Visit: Payer: Self-pay | Admitting: Medical-Surgical

## 2021-07-10 DIAGNOSIS — I10 Essential (primary) hypertension: Secondary | ICD-10-CM

## 2021-07-14 ENCOUNTER — Other Ambulatory Visit: Payer: Self-pay | Admitting: Medical-Surgical

## 2021-07-14 DIAGNOSIS — I10 Essential (primary) hypertension: Secondary | ICD-10-CM

## 2021-07-21 ENCOUNTER — Ambulatory Visit: Payer: 59 | Admitting: Medical-Surgical

## 2021-07-22 ENCOUNTER — Other Ambulatory Visit: Payer: Self-pay | Admitting: Orthopaedic Surgery

## 2021-07-22 DIAGNOSIS — M25462 Effusion, left knee: Secondary | ICD-10-CM

## 2021-07-23 ENCOUNTER — Ambulatory Visit: Payer: 59 | Admitting: Medical-Surgical

## 2021-07-27 ENCOUNTER — Other Ambulatory Visit: Payer: Self-pay | Admitting: Physician Assistant

## 2021-07-27 ENCOUNTER — Telehealth: Payer: Self-pay | Admitting: Orthopaedic Surgery

## 2021-07-27 DIAGNOSIS — M25461 Effusion, right knee: Secondary | ICD-10-CM

## 2021-07-27 MED ORDER — DICLOFENAC SODIUM 75 MG PO TBEC: 75.0000 mg | DELAYED_RELEASE_TABLET | Freq: Two times a day (BID) | ORAL | 3 refills | Status: DC

## 2021-07-27 NOTE — Telephone Encounter (Signed)
Sent in

## 2021-07-27 NOTE — Telephone Encounter (Signed)
Pt's girlfriend Cecille Rubin called requesting a refill of anti inflammatory medication for pt. Please call pt at [phone].

## 2021-08-12 ENCOUNTER — Other Ambulatory Visit: Payer: Self-pay | Admitting: Medical-Surgical

## 2021-08-12 DIAGNOSIS — I10 Essential (primary) hypertension: Secondary | ICD-10-CM

## 2021-09-02 ENCOUNTER — Ambulatory Visit (INDEPENDENT_AMBULATORY_CARE_PROVIDER_SITE_OTHER): Payer: 59 | Admitting: Medical-Surgical

## 2021-09-02 ENCOUNTER — Encounter: Payer: Self-pay | Admitting: Medical-Surgical

## 2021-09-02 VITALS — BP 124/85 | HR 90 | Resp 20 | Ht 67.0 in | Wt 234.6 lb

## 2021-09-02 DIAGNOSIS — Z23 Encounter for immunization: Secondary | ICD-10-CM

## 2021-09-02 DIAGNOSIS — I10 Essential (primary) hypertension: Secondary | ICD-10-CM | POA: Diagnosis not present

## 2021-09-02 DIAGNOSIS — R748 Abnormal levels of other serum enzymes: Secondary | ICD-10-CM

## 2021-09-02 DIAGNOSIS — L83 Acanthosis nigricans: Secondary | ICD-10-CM | POA: Diagnosis not present

## 2021-09-02 NOTE — Progress Notes (Signed)
   Established Patient Office Visit  Subjective   Patient ID: Richard Sexton, male   DOB: 1965/12/13 Age: 56 y.o. MRN: [id number]   Chief Complaint  Patient presents with   Hypertension    HPI Pleasant 56 year old male presenting today for follow up on HTN. Medication: amlodipine '5mg'$  daily, losartan '50mg'$  daily Compliant: yes, occasionally misses doses Side effects: none Checking BP at home: no, needs new cuff Low sodium diet: somewhat, continues to eat some high sodium foods Exercise: short warm up at work, no other intentional exercise Concerning symptoms: none   Objective:    Vitals:   09/02/21 1544 09/02/21 1629  BP: (!) 144/109 124/85  Pulse: 97 90  Resp: 20 20  Height: '5\' 7"'$  (1.702 m)   Weight: 234 lb 9.6 oz (106.4 kg)   SpO2: 95% 95%  BMI (Calculated): 36.73    Physical Exam Vitals reviewed.  Constitutional:      General: He is not in acute distress.    Appearance: Normal appearance. He is obese. He is not ill-appearing.  HENT:     Head: Normocephalic.  Cardiovascular:     Rate and Rhythm: Normal rate.     Pulses: Normal pulses.     Heart sounds: Normal heart sounds. No murmur heard.    No friction rub. No gallop.  Pulmonary:     Effort: Pulmonary effort is normal. No respiratory distress.     Breath sounds: Normal breath sounds.  Skin:    General: Skin is warm and dry.     Comments: Acanthosis nigricans on the posterior and sides of the neck  Neurological:     Mental Status: He is alert and oriented to person, place, and time.  Psychiatric:        Mood and Affect: Mood normal.        Behavior: Behavior normal.        Thought Content: Thought content normal.        Judgment: Judgment normal.   No results found for this or any previous visit (from the past 24 hour(s)).     The 10-year ASCVD risk score (Arnett DK, et al., 2019) is: 15.7%   Values used to calculate the score:     Age: 71 years     Sex: Male     Is Non-Hispanic African American: Yes      Diabetic: No     Tobacco smoker: Yes     Systolic Blood Pressure: 893 mmHg     Is BP treated: Yes     HDL Cholesterol: 54 mg/dL     Total Cholesterol: 155 mg/dL   Assessment & Plan:   1. Essential hypertension Checking labs as below. BP elevated on arrival but much better and at goal on recheck. Continue Amlodipine '5mg'$  and Losartan '50mg'$  daily. Recommend getting a new BP cuff. Continue to monitor BP at home with a goal of 130/80 or less. Recommend low sodium diet, regular intentional exercise, and maintaining a healthy weight.  - CBC with Differential/Platelet - COMPLETE METABOLIC PANEL WITH GFR - Lipid panel  2. Acanthosis nigricans Checking A1c.  - Hemoglobin A1c  3. Need for shingles vaccine Shingrix given in office today.  Return in about 6 months (around 03/05/2022) for HTN follow up.  ___________________________________________ Clearnce Sorrel, DNP, APRN, FNP-BC [facility]

## 2021-09-03 LAB — CBC WITH DIFFERENTIAL/PLATELET
Absolute Monocytes: 622 cells/uL (ref 200–950)
Basophils Absolute: 28 cells/uL (ref 0–200)
Basophils Relative: 0.5 %
Eosinophils Absolute: 110 cells/uL (ref 15–500)
Eosinophils Relative: 2 %
HCT: 45.7 % (ref 38.5–50.0)
Hemoglobin: 14.7 g/dL (ref 13.2–17.1)
Lymphs Abs: 2079 cells/uL (ref 850–3900)
MCH: 29.1 pg (ref 27.0–33.0)
MCHC: 32.2 g/dL (ref 32.0–36.0)
MCV: 90.5 fL (ref 80.0–100.0)
MPV: 13.4 fL — ABNORMAL HIGH (ref 7.5–12.5)
Monocytes Relative: 11.3 %
Neutro Abs: 2662 cells/uL (ref 1500–7800)
Neutrophils Relative %: 48.4 %
Platelets: 155 10*3/uL (ref 140–400)
RBC: 5.05 10*6/uL (ref 4.20–5.80)
RDW: 14.5 % (ref 11.0–15.0)
Total Lymphocyte: 37.8 %
WBC: 5.5 10*3/uL (ref 3.8–10.8)

## 2021-09-03 LAB — COMPLETE METABOLIC PANEL WITH GFR
AG Ratio: 1.3 (calc) (ref 1.0–2.5)
ALT: 70 U/L — ABNORMAL HIGH (ref 9–46)
AST: 61 U/L — ABNORMAL HIGH (ref 10–35)
Albumin: 4.2 g/dL (ref 3.6–5.1)
Alkaline phosphatase (APISO): 43 U/L (ref 35–144)
BUN: 22 mg/dL (ref 7–25)
CO2: 31 mmol/L (ref 20–32)
Calcium: 10.5 mg/dL — ABNORMAL HIGH (ref 8.6–10.3)
Chloride: 102 mmol/L (ref 98–110)
Creat: 1.08 mg/dL (ref 0.70–1.30)
Globulin: 3.2 g/dL (calc) (ref 1.9–3.7)
Glucose, Bld: 97 mg/dL (ref 65–99)
Potassium: 4.5 mmol/L (ref 3.5–5.3)
Sodium: 142 mmol/L (ref 135–146)
Total Bilirubin: 0.4 mg/dL (ref 0.2–1.2)
Total Protein: 7.4 g/dL (ref 6.1–8.1)
eGFR: 81 mL/min/{1.73_m2} (ref >=60)

## 2021-09-03 LAB — LIPID PANEL
Cholesterol: 168 mg/dL (ref <200)
HDL: 54 mg/dL (ref >=40)
LDL Cholesterol (Calc): 93 mg/dL (calc)
Non-HDL Cholesterol (Calc): 114 mg/dL (calc) (ref <130)
Total CHOL/HDL Ratio: 3.1 (calc) (ref <5.0)
Triglycerides: 111 mg/dL (ref <150)

## 2021-09-03 LAB — HEMOGLOBIN A1C
Hgb A1c MFr Bld: 5.4 % of total Hgb (ref <5.7)
Mean Plasma Glucose: 108 mg/dL
eAG (mmol/L): 6 mmol/L

## 2021-09-03 NOTE — Addendum Note (Signed)
Addended bySamuel Bouche on: 09/03/2021 06:35 PM   Modules accepted: Orders

## 2021-09-06 ENCOUNTER — Other Ambulatory Visit: Payer: Self-pay | Admitting: Neurology

## 2021-09-06 DIAGNOSIS — R748 Abnormal levels of other serum enzymes: Secondary | ICD-10-CM

## 2021-09-07 ENCOUNTER — Other Ambulatory Visit: Payer: Self-pay | Admitting: Medical-Surgical

## 2021-09-07 DIAGNOSIS — I10 Essential (primary) hypertension: Secondary | ICD-10-CM

## 2021-10-29 ENCOUNTER — Ambulatory Visit (HOSPITAL_COMMUNITY)
Admission: EM | Admit: 2021-10-29 | Discharge: 2021-10-29 | Disposition: A | Discharge status: Home / Self Care | Payer: 59 | Attending: Family Medicine | Admitting: Family Medicine

## 2021-10-29 ENCOUNTER — Ambulatory Visit (INDEPENDENT_AMBULATORY_CARE_PROVIDER_SITE_OTHER): Payer: 59

## 2021-10-29 ENCOUNTER — Encounter (HOSPITAL_COMMUNITY): Payer: Self-pay

## 2021-10-29 DIAGNOSIS — M549 Dorsalgia, unspecified: Secondary | ICD-10-CM

## 2021-10-29 DIAGNOSIS — M546 Pain in thoracic spine: Secondary | ICD-10-CM | POA: Diagnosis not present

## 2021-10-29 DIAGNOSIS — L84 Corns and callosities: Secondary | ICD-10-CM | POA: Insufficient documentation

## 2021-10-29 DIAGNOSIS — M2041 Other hammer toe(s) (acquired), right foot: Secondary | ICD-10-CM | POA: Insufficient documentation

## 2021-10-29 MED ORDER — TIZANIDINE HCL 4 MG PO TABS: 4.0000 mg | ORAL_TABLET | Freq: Three times a day (TID) | ORAL | 0 refills | Status: DC | PRN

## 2021-10-29 MED ORDER — TRAMADOL HCL 50 MG PO TABS: 50.0000 mg | ORAL_TABLET | Freq: Four times a day (QID) | ORAL | 0 refills | Status: AC | PRN

## 2021-10-29 NOTE — ED Triage Notes (Signed)
Pt states right flank pain for the past week. Denies any injury.

## 2021-10-29 NOTE — Discharge Instructions (Addendum)
Your x-ray did not show any trouble in your lungs or any rib trouble.  Take tramadol 50 mg-- 1 tablet every 6 hours as needed for pain.  This medication can make you sleepy or dizzy   Take tizanidine 4 mg--1 every 8 hours as needed for muscle spasms; it is a muscle relaxer, and it can also make you sleepy or dizzy.   These follow-up with your primary care about this pain.

## 2021-10-29 NOTE — ED Provider Notes (Signed)
[place]    CSN: [id number] Arrival date & time: 10/29/21  5809      History   Chief Complaint Chief Complaint  Patient presents with   Back Pain    HPI Richard Sexton is a 56 y.o. male.    Back Pain  Here for right upper back pain.  Is been going on for about a week.  No prior overuse or trauma or fall.  No fever or chills or cough.  No hematuria or dysuria.  It seems to at least get worse when he reaches up with his right hand.  Heating pad did not help.  He states it feels balled up and tight there.  Past medical history is significant for hypertension.  He is already taking diclofenac for knee pain.  And took that this morning  Past Medical History:  Diagnosis Date   Allergy    OTC meds PRN    Broken leg 1975   no surgery ,PT was casted - LT leg   ELEVATED BLOOD PRESSURE WITHOUT DIAGNOSIS OF HYPERTENSION 02/16/2009   Qualifier: Diagnosis of  By: Jorene Minors, Scott     GERD (gastroesophageal reflux disease)    occ   Hx of adenomatous polyp of colon 06/2019   Hypertension    Mass of head    right forehead    Patient Active Problem List   Diagnosis Date Noted   Bilateral knee swelling 02/04/2020   S4 (fourth heart sound) 10/25/2019   Abnormal finding on EKG 10/25/2019   Essential hypertension 11/19/2017   SMOKER 04/28/2009   COCAINE ABUSE, HX OF 04/28/2009   EPIDERMOID CYST 02/16/2009    Past Surgical History:  Procedure Laterality Date   CARPAL TUNNEL RELEASE Bilateral    COLONOSCOPY W/ POLYPECTOMY  06/2019   diminutive adenoma recall 2029   LEG SURGERY Right 1986   Femur shorten   PVC ABLATION N/A 01/31/2020   Procedure: PVC ABLATION;  Surgeon: Vickie Epley, MD;  Location: [facility];  Service: Cardiovascular;  Laterality: N/A;       Home Medications    Prior to Admission medications   Medication Sig Start Date End Date Taking? Authorizing Provider  tiZANidine (ZANAFLEX) 4 MG tablet Take 1 tablet (4 mg total) by  mouth every 8 (eight) hours as needed for muscle spasms. 10/29/21  Yes Wyatt Thorstenson, Gwenlyn Perking, MD  traMADol (ULTRAM) 50 MG tablet Take 1 tablet (50 mg total) by mouth every 6 (six) hours as needed (pain). 10/29/21  Yes Barrett Henle, MD  amLODipine (NORVASC) 5 MG tablet TAKE 1 & 1/2 (ONE & ONE-HALF) TABLETS BY MOUTH ONCE DAILY 07/14/21   Samuel Bouche, NP  diclofenac (VOLTAREN) 75 MG EC tablet Take 1 tablet (75 mg total) by mouth 2 (two) times daily. 07/27/21 07/27/22  Aundra Dubin, PA-C  losartan (COZAAR) 50 MG tablet Take 1 tablet by mouth once daily 09/07/21   Samuel Bouche, NP  PENNSAID 2 % SOLN Apply 1 Pump topically in the morning and at bedtime. 08/31/20   Gregor Hams, MD  sildenafil (VIAGRA) 100 MG tablet Take 0.5-1 tablets (50-100 mg total) by mouth daily as needed for erectile dysfunction. 10/27/20   Samuel Bouche, NP  triamcinolone ointment (KENALOG) 0.1 % Apply 1 application topically 2 (two) times daily. To affected areas 04/17/20   Samuel Bouche, NP    Family History Family History  Problem Relation Age of Onset   Diabetes Mother    Hypertension Mother  Colon cancer Neg Hx    Colon polyps Neg Hx    Esophageal cancer Neg Hx    Rectal cancer Neg Hx    Stomach cancer Neg Hx     Social History Social History   Tobacco Use   Smoking status: Every Day    Packs/day: 0.75    Years: 8.00    Total pack years: 6.00    Types: Cigarettes   Smokeless tobacco: Never  Vaping Use   Vaping Use: Never used  Substance Use Topics   Alcohol use: Yes    Comment: 1-2 drinks/week, beer or liquor   Drug use: No     Allergies   Bactrim [sulfamethoxazole-trimethoprim]   Review of Systems Review of Systems  Musculoskeletal:  Positive for back pain.     Physical Exam Triage Vital Signs ED Triage Vitals  Enc Vitals Group     BP 10/29/21 0859 (!) 136/92     Pulse Rate 10/29/21 0859 75     Resp 10/29/21 0859 16     Temp 10/29/21 0859 98.3 F (36.8 C)     Temp Source 10/29/21 0859 Oral      SpO2 10/29/21 0859 96 %     Weight --      Height --      Head Circumference --      Peak Flow --      Pain Score 10/29/21 0900 10     Pain Loc --      Pain Edu? --      Excl. in [employer] [employer]? --    No data found.  Updated Vital Signs BP (!) 136/92 (BP Location: Left Arm)   Pulse 75   Temp 98.3 F (36.8 C) (Oral)   Resp 16   SpO2 96%   Visual Acuity Right Eye Distance:   Left Eye Distance:   Bilateral Distance:    Right Eye Near:   Left Eye Near:    Bilateral Near:     Physical Exam Vitals reviewed.  Constitutional:      General: He is not in acute distress.    Appearance: He is not ill-appearing, toxic-appearing or diaphoretic.  HENT:     Mouth/Throat:     Mouth: Mucous membranes are moist.  Eyes:     Extraocular Movements: Extraocular movements intact.     Pupils: Pupils are equal, round, and reactive to light.  Cardiovascular:     Rate and Rhythm: Normal rate and regular rhythm.     Heart sounds: No murmur heard. Pulmonary:     Effort: Pulmonary effort is normal. No respiratory distress.     Breath sounds: No stridor. No wheezing, rhonchi or rales.  Chest:     Chest wall: No tenderness.  Abdominal:     Tenderness: There is no right CVA tenderness or left CVA tenderness.  Skin:    Coloration: Skin is not jaundiced or pale.     Comments: No rash noted in the right upper or lower back.  Neurological:     General: No focal deficit present.     Mental Status: He is alert and oriented to person, place, and time.  Psychiatric:        Behavior: Behavior normal.      UC Treatments / Results  Labs (all labs ordered are listed, but only abnormal results are displayed) Labs Reviewed - No data to display  EKG   Radiology DG Chest 2 View  Result Date: 10/29/2021 CLINICAL DATA:  Right upper back  pain. EXAM: CHEST - 2 VIEW COMPARISON:  April levin 2019. FINDINGS: The heart size and mediastinal contours are within normal limits. Both lungs are clear. The  visualized skeletal structures are unremarkable. IMPRESSION: No active cardiopulmonary disease. Electronically Signed   By: Marijo Conception M.D.   On: 10/29/2021 09:41    Procedures Procedures (including critical care time)  Medications Ordered in UC Medications - No data to display  Initial Impression / Assessment and Plan / UC Course  I have reviewed the triage vital signs and the nursing notes.  Pertinent labs & imaging results that were available during my care of the patient were reviewed by me and considered in my medical decision making (see chart for details).         Chest x-ray is negative.  I will not do a Toradol injection since he has taken diclofenac today.  Pain relief with tramadol and muscle relaxer is provided.  I would like him to follow-up with his PCP Final Clinical Impressions(s) / UC Diagnoses   Final diagnoses:  Upper back pain on right side     Discharge Instructions      Your x-ray did not show any trouble in your lungs or any rib trouble.  Take tramadol 50 mg-- 1 tablet every 6 hours as needed for pain.  This medication can make you sleepy or dizzy   Take tizanidine 4 mg--1 every 8 hours as needed for muscle spasms; it is a muscle relaxer, and it can also make you sleepy or dizzy.   These follow-up with your primary care about this pain.      ED Prescriptions     Medication Sig Dispense Auth. Provider   traMADol (ULTRAM) 50 MG tablet Take 1 tablet (50 mg total) by mouth every 6 (six) hours as needed (pain). 12 tablet Zuriyah Shatz, Gwenlyn Perking, MD   tiZANidine (ZANAFLEX) 4 MG tablet Take 1 tablet (4 mg total) by mouth every 8 (eight) hours as needed for muscle spasms. 21 tablet Couper Juncaj, Gwenlyn Perking, MD      I have reviewed the PDMP during this encounter.   Barrett Henle, MD 10/29/21 [phone]

## 2021-11-27 ENCOUNTER — Other Ambulatory Visit: Payer: Self-pay | Admitting: Physician Assistant

## 2021-11-27 DIAGNOSIS — M25462 Effusion, left knee: Secondary | ICD-10-CM

## 2021-12-08 ENCOUNTER — Other Ambulatory Visit: Payer: Self-pay

## 2021-12-08 DIAGNOSIS — M25461 Effusion, right knee: Secondary | ICD-10-CM

## 2021-12-08 MED ORDER — DICLOFENAC SODIUM 75 MG PO TBEC: 75.0000 mg | DELAYED_RELEASE_TABLET | Freq: Two times a day (BID) | ORAL | 1 refills | Status: DC

## 2021-12-08 NOTE — Progress Notes (Signed)
I apologize, I went back in the encounter and pended the correct medication.

## 2021-12-08 NOTE — Progress Notes (Signed)
Patient called requesting a refill on his anti-inflammatory medication.  This medication was prescribed by [facility] on 07/27/2021.  Last office visit 09/02/2021

## 2022-01-14 ENCOUNTER — Other Ambulatory Visit: Payer: Self-pay

## 2022-01-14 ENCOUNTER — Other Ambulatory Visit: Payer: Self-pay | Admitting: Medical-Surgical

## 2022-01-14 DIAGNOSIS — I10 Essential (primary) hypertension: Secondary | ICD-10-CM

## 2022-01-14 MED ORDER — LOSARTAN POTASSIUM 50 MG PO TABS: 50.0000 mg | ORAL_TABLET | Freq: Every day | ORAL | 0 refills | Status: DC

## 2022-02-04 ENCOUNTER — Other Ambulatory Visit: Payer: Self-pay | Admitting: Medical-Surgical

## 2022-02-04 DIAGNOSIS — M25461 Effusion, right knee: Secondary | ICD-10-CM

## 2022-03-07 ENCOUNTER — Encounter: Payer: Self-pay | Admitting: Medical-Surgical

## 2022-03-07 ENCOUNTER — Ambulatory Visit (INDEPENDENT_AMBULATORY_CARE_PROVIDER_SITE_OTHER): Payer: 59 | Admitting: Medical-Surgical

## 2022-03-07 VITALS — BP 110/77 | HR 91 | Resp 20 | Ht 67.0 in | Wt 237.3 lb

## 2022-03-07 DIAGNOSIS — M25462 Effusion, left knee: Secondary | ICD-10-CM

## 2022-03-07 DIAGNOSIS — N529 Male erectile dysfunction, unspecified: Secondary | ICD-10-CM

## 2022-03-07 DIAGNOSIS — Z23 Encounter for immunization: Secondary | ICD-10-CM

## 2022-03-07 DIAGNOSIS — I10 Essential (primary) hypertension: Secondary | ICD-10-CM

## 2022-03-07 DIAGNOSIS — M25461 Effusion, right knee: Secondary | ICD-10-CM

## 2022-03-07 MED ORDER — DICLOFENAC SODIUM 75 MG PO TBEC: 75.0000 mg | DELAYED_RELEASE_TABLET | Freq: Two times a day (BID) | ORAL | 1 refills | Status: DC

## 2022-03-07 MED ORDER — LOSARTAN POTASSIUM 50 MG PO TABS: 50.0000 mg | ORAL_TABLET | Freq: Every day | ORAL | 3 refills | Status: DC

## 2022-03-07 MED ORDER — SILDENAFIL CITRATE 100 MG PO TABS: 50.0000 mg | ORAL_TABLET | Freq: Every day | ORAL | 11 refills | Status: DC | PRN

## 2022-03-07 MED ORDER — AMLODIPINE BESYLATE 5 MG PO TABS: ORAL_TABLET | ORAL | 3 refills | Status: DC

## 2022-03-07 NOTE — Addendum Note (Signed)
Addended by: Beverlee Nims on: 03/07/2022 04:28 PM   Modules accepted: Orders

## 2022-03-07 NOTE — Progress Notes (Signed)
Established Patient Office Visit  Subjective   Patient ID: Richard Sexton, male   DOB: [birth year] Age: 57 y.o. MRN: [id number]   Chief Complaint  Patient presents with   Follow-up   Hypertension   HPI 57 year old male presenting today to follow-up on:  Hypertension: Previously checking blood pressures at home and reports readings were at or below goal.  Unfortunately, has misplaced his blood pressure cuff but will look for it to better keep track of his blood pressures.  Taking amlodipine 7.5 mg and losartan 50 mg daily, tolerating well without side effects.  Uses a salt substitute and names for low-sodium dietary options.  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  No outside exercise.  Denies CP, SOB, palpitations, lower extremity edema, dizziness, headaches, or vision changes.  Erectile dysfunction: Using sildenafil 50 mg daily as needed, tolerating well without side effects.  Feels the medication works very well for him and would like to have a refill.  Knee pain: Taking diclofenac 75 mg twice daily as needed.  Only averages 1 dose per day and does not take this every day.  Reports that the medication is very helpful.   Objective:    Vitals:   03/07/22 1541  BP: 110/77  Pulse: 91  Resp: 20  Height: '5\' 7"'$  (1.702 m)  Weight: 237 lb 4.8 oz (107.6 kg)  SpO2: 97%  BMI (Calculated): 37.16    Physical Exam Vitals reviewed.  Constitutional:      General: He is not in acute distress.    Appearance: Normal appearance. He is obese. He is not ill-appearing.  HENT:     Head: Normocephalic and atraumatic.  Cardiovascular:     Rate and Rhythm: Normal rate and regular rhythm.     Pulses: Normal pulses.     Heart sounds: Normal heart sounds. No murmur heard.    No friction rub. No gallop.  Pulmonary:     Effort: Pulmonary effort is normal. No respiratory distress.     Breath sounds: Normal breath sounds.  Skin:    General: Skin is warm  and dry.  Neurological:     Mental Status: He is alert and oriented to person, place, and time.  Psychiatric:        Mood and Affect: Mood normal.        Behavior: Behavior normal.        Thought Content: Thought content normal.        Judgment: Judgment normal.   No results found for this or any previous visit (from the past 24 hour(s)).     The 10-year ASCVD risk score (Arnett DK, et al., 2019) is: 13%   Values used to calculate the score:     Age: 73 years     Sex: Male     Is Non-Hispanic African American: Yes     Diabetic: No     Tobacco smoker: Yes     Systolic Blood Pressure: 539 mmHg     Is BP treated: Yes     HDL Cholesterol: 54 mg/dL     Total Cholesterol: 168 mg/dL   Assessment & Plan:   1. Essential hypertension Blood pressure looks great today at 110/77.  Continue monitoring blood pressure at home with a goal of 130/80 or less.  Recommend low-sodium diet and regular intentional exercise for better management.  Continue amlodipine 7.5 mg and losartan 50 mg daily as prescribed.  Checking CMP today. - amLODipine (  NORVASC) 5 MG tablet; TAKE 1 & 1/2 (ONE & ONE-HALF) TABLETS BY MOUTH ONCE DAILY  Dispense: 135 tablet; Refill: 3 - losartan (COZAAR) 50 MG tablet; Take 1 tablet (50 mg total) by mouth daily.  Dispense: 90 tablet; Refill: 3 - COMPLETE METABOLIC PANEL WITH GFR  2. Bilateral knee swelling Currently well-managed.  Refilling diclofenac 75 mg twice daily as needed. - diclofenac (VOLTAREN) 75 MG EC tablet; Take 1 tablet (75 mg total) by mouth 2 (two) times daily.  Dispense: 180 tablet; Refill: 1  3. Vasculogenic erectile dysfunction, unspecified vasculogenic erectile dysfunction type Refilling sildenafil as requested.  Return in about 6 months (around 09/05/2022) for HTN follow up.  ___________________________________________ Clearnce Sorrel, DNP, APRN, FNP-BC [facility]

## 2022-03-08 LAB — COMPLETE METABOLIC PANEL WITH GFR
AG Ratio: 1.5 (calc) (ref 1.0–2.5)
ALT: 30 U/L (ref 9–46)
AST: 31 U/L (ref 10–35)
Albumin: 4.5 g/dL (ref 3.6–5.1)
Alkaline phosphatase (APISO): 47 U/L (ref 35–144)
BUN: 20 mg/dL (ref 7–25)
CO2: 31 mmol/L (ref 20–32)
Calcium: 9.7 mg/dL (ref 8.6–10.3)
Chloride: 102 mmol/L (ref 98–110)
Creat: 1.03 mg/dL (ref 0.70–1.30)
Globulin: 3.1 g/dL (calc) (ref 1.9–3.7)
Glucose, Bld: 96 mg/dL (ref 65–99)
Potassium: 4.8 mmol/L (ref 3.5–5.3)
Sodium: 141 mmol/L (ref 135–146)
Total Bilirubin: 0.4 mg/dL (ref 0.2–1.2)
Total Protein: 7.6 g/dL (ref 6.1–8.1)
eGFR: 86 mL/min/{1.73_m2} (ref >=60)

## 2022-03-20 ENCOUNTER — Ambulatory Visit (HOSPITAL_COMMUNITY)
Admission: EM | Admit: 2022-03-20 | Discharge: 2022-03-20 | Disposition: A | Discharge status: Home / Self Care | Payer: No Typology Code available for payment source | Attending: Physician Assistant | Admitting: Physician Assistant

## 2022-03-20 ENCOUNTER — Encounter (HOSPITAL_COMMUNITY): Payer: Self-pay | Admitting: *Deleted

## 2022-03-20 ENCOUNTER — Other Ambulatory Visit: Payer: Self-pay

## 2022-03-20 DIAGNOSIS — J9801 Acute bronchospasm: Secondary | ICD-10-CM | POA: Diagnosis not present

## 2022-03-20 DIAGNOSIS — R051 Acute cough: Secondary | ICD-10-CM

## 2022-03-20 MED ORDER — PREDNISONE 10 MG PO TABS: 20.0000 mg | ORAL_TABLET | Freq: Every day | ORAL | 0 refills | Status: DC

## 2022-03-20 MED ORDER — ALBUTEROL SULFATE HFA 108 (90 BASE) MCG/ACT IN AERS: 1.0000 | INHALATION_SPRAY | Freq: Four times a day (QID) | RESPIRATORY_TRACT | 0 refills | Status: AC | PRN

## 2022-03-20 MED ORDER — IPRATROPIUM-ALBUTEROL 0.5-2.5 (3) MG/3ML IN SOLN
RESPIRATORY_TRACT | Status: AC
  Filled 2022-03-20: qty 3

## 2022-03-20 MED ORDER — IPRATROPIUM-ALBUTEROL 0.5-2.5 (3) MG/3ML IN SOLN
3.0000 mL | Freq: Once | RESPIRATORY_TRACT | Status: AC
  Administered 2022-03-20: 3 mL via RESPIRATORY_TRACT

## 2022-03-20 NOTE — ED Triage Notes (Signed)
Cough since Friday. Pt reports the cough keeps him awake. Pt has tried OTC with out relief. Muscle pain from coughing.

## 2022-03-20 NOTE — ED Provider Notes (Signed)
[place]    CSN: [id number] Arrival date & time: 03/20/22  1458      History   Chief Complaint Chief Complaint  Patient presents with   Cough    HPI Richard Sexton is a 57 y.o. male.   Patient here concerned with cough x 5 days.  Admits nasal congestion, rhinorrhea, cough.  Denies f/c, n/v/d, abdomianl pain, wheezing, SOB.  Reports 20+ year history of smoking, 1 PPD.  Denies history of asthma, COPD. He reports negative home COVID test, declines COVID testing today.     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   [place] molle 10/29/2021   Bilateral knee swelling 02/04/2020   S4 (fourth heart sound) 10/25/2019   Abnormal finding on EKG 10/25/2019   Essential hypertension 11/19/2017   SMOKER 04/28/2009   COCAINE ABUSE, HX OF 04/28/2009    Past Surgical History:  Procedure Laterality Date   CARPAL TUNNEL RELEASE Bilateral    COLONOSCOPY W/ POLYPECTOMY  06/2019   diminutive adenoma recall 2029   LEG SURGERY Right 1986   Femur shorten   PVC ABLATION N/A 01/31/2020   Procedure: PVC ABLATION;  Surgeon: Vickie Epley, MD;  Location: [facility];  Service: Cardiovascular;  Laterality: N/A;       Home Medications    Prior to Admission medications   Medication Sig Start Date End Date Taking? Authorizing Provider  albuterol (VENTOLIN HFA) 108 (90 Base) MCG/ACT inhaler Inhale 1-2 puffs into the lungs every 6 (six) hours as needed for wheezing or shortness of breath. 03/20/22  Yes Peri Jefferson, PA-C  predniSONE  (DELTASONE) 10 MG tablet Take 2 tablets (20 mg total) by mouth daily. 03/20/22  Yes Peri Jefferson, PA-C  amLODipine (NORVASC) 5 MG tablet TAKE 1 & 1/2 (ONE & ONE-HALF) TABLETS BY MOUTH ONCE DAILY 03/07/22   Samuel Bouche, NP  diclofenac (VOLTAREN) 75 MG EC tablet Take 1 tablet (75 mg total) by mouth 2 (two) times daily. 03/07/22   Samuel Bouche, NP  losartan (COZAAR) 50 MG tablet Take 1 tablet (50 mg total) by mouth daily. 03/07/22   Samuel Bouche, NP  PENNSAID 2 % SOLN Apply 1 Pump topically in the morning and at bedtime. 08/31/20   Gregor Hams, MD  sildenafil (VIAGRA) 100 MG tablet Take 0.5-1 tablets (50-100 mg total) by mouth daily as needed for erectile dysfunction. 03/07/22   Samuel Bouche, NP  tiZANidine (ZANAFLEX) 4 MG tablet Take 1 tablet (4 mg total) by mouth every 8 (eight) hours as needed for muscle spasms. 10/29/21   Barrett Henle, MD  traMADol (ULTRAM) 50 MG tablet Take 1 tablet (50 mg total) by mouth every 6 (six) hours as needed (pain). 10/29/21   Barrett Henle, MD  triamcinolone ointment (KENALOG) 0.1 % Apply 1 application topically 2 (two) times daily. To affected areas 04/17/20   Samuel Bouche, NP    Family History Family History  Problem  Relation Age of Onset   Diabetes Mother    Hypertension Mother    Colon cancer Neg Hx    Colon polyps Neg Hx    Esophageal cancer Neg Hx    Rectal cancer Neg Hx    Stomach cancer Neg Hx     Social History Social History   Tobacco Use   Smoking status: Every Day    Packs/day: 0.75    Years: 8.00    Total pack years: 6.00    Types: Cigarettes   Smokeless tobacco: Never  Vaping Use   Vaping Use: Never used  Substance Use Topics   Alcohol use: Yes    Comment: 1-2 drinks/week, beer or liquor   Drug use: No     Allergies   Bactrim [sulfamethoxazole-trimethoprim]   Review of Systems Review of Systems  Constitutional:  Negative for chills, fatigue and fever.  HENT:  Positive for congestion and rhinorrhea. Negative for ear pain,  nosebleeds, postnasal drip, sinus pressure, sinus pain and sore throat.   Eyes:  Negative for pain and redness.  Respiratory:  Positive for cough. Negative for shortness of breath and wheezing.   Gastrointestinal:  Negative for abdominal pain, diarrhea, nausea and vomiting.  Musculoskeletal:  Negative for arthralgias and myalgias.  Skin:  Negative for rash.  Neurological:  Negative for light-headedness and headaches.  Hematological:  Negative for adenopathy. Does not bruise/bleed easily.  Psychiatric/Behavioral:  Negative for confusion and sleep disturbance.      Physical Exam Triage Vital Signs ED Triage Vitals  Enc Vitals Group     BP 03/20/22 1616 (!) 131/91     Pulse Rate 03/20/22 1616 94     Resp 03/20/22 1616 20     Temp 03/20/22 1616 98.6 F (37 C)     Temp src --      SpO2 03/20/22 1616 94 %     Weight --      Height --      Head Circumference --      Peak Flow --      Pain Score 03/20/22 1614 6     Pain Loc --      Pain Edu? --      Excl. in [place]? --    No data found.  Updated Vital Signs BP (!) 131/91   Pulse 94   Temp 98.6 F (37 C)   Resp 20   SpO2 94%   Visual Acuity Right Eye Distance:   Left Eye Distance:   Bilateral Distance:    Right Eye Near:   Left Eye Near:    Bilateral Near:     Physical Exam Vitals and nursing note reviewed.  Constitutional:      General: He is not in acute distress.    Appearance: Normal appearance. He is not ill-appearing.  HENT:     Head: Normocephalic and atraumatic.     Right Ear: Tympanic membrane and ear canal normal.     Left Ear: Tympanic membrane and ear canal normal.     Nose: Mucosal edema, congestion and rhinorrhea present. Rhinorrhea is clear.     Mouth/Throat:     Pharynx: No oropharyngeal exudate or posterior oropharyngeal erythema.  Eyes:     General: No scleral icterus.    Extraocular Movements: Extraocular movements intact.     Conjunctiva/sclera: Conjunctivae normal.  Cardiovascular:     Rate  and Rhythm: Normal rate and regular rhythm.     Heart sounds: No murmur heard. Pulmonary:  Effort: Pulmonary effort is normal. No respiratory distress.     Breath sounds: No decreased air movement. Wheezing (throughout) present. No decreased breath sounds, rhonchi or rales.  Musculoskeletal:     Cervical back: Normal range of motion. No rigidity.  Lymphadenopathy:     Cervical: No cervical adenopathy.  Skin:    Coloration: Skin is not jaundiced.     Findings: No rash.  Neurological:     General: No focal deficit present.     Mental Status: He is alert and oriented to person, place, and time.     Motor: No weakness.     Gait: Gait normal.  Psychiatric:        Mood and Affect: Mood normal.        Behavior: Behavior normal.      UC Treatments / Results  Labs (all labs ordered are listed, but only abnormal results are displayed) Labs Reviewed - No data to display  EKG   Radiology No results found.  Procedures Procedures (including critical care time)  Medications Ordered in UC Medications  ipratropium-albuterol (DUONEB) 0.5-2.5 (3) MG/3ML nebulizer solution 3 mL (3 mLs Nebulization Given 03/20/22 1644)    Initial Impression / Assessment and Plan / UC Course  I have reviewed the triage vital signs and the nursing notes.  Pertinent labs & imaging results that were available during my care of the patient were reviewed by me and considered in my medical decision making (see chart for details).     Wheezing improved after nebulizer treatment Patient reports sx improvement Take albuterol inhaler q4h for the first 24 hours Follow up with PCP Return with worsening symptoms Final Clinical Impressions(s) / UC Diagnoses   Final diagnoses:  Acute cough  Acute bronchospasm   Discharge Instructions   None    ED Prescriptions     Medication Sig Dispense Auth. Provider   albuterol (VENTOLIN HFA) 108 (90 Base) MCG/ACT inhaler Inhale 1-2 puffs into the lungs every 6  (six) hours as needed for wheezing or shortness of breath. 18 g Peri Jefferson, PA-C   predniSONE (DELTASONE) 10 MG tablet Take 2 tablets (20 mg total) by mouth daily. 6 tablet Peri Jefferson, PA-C      PDMP not reviewed this encounter.   Peri Jefferson, PA-C 03/20/22 1711

## 2022-03-30 IMAGING — MR MR CARD MORPHOLOGY WO/W CM
45 of 48 series · 45 of 48 positions shown · IV contrast (gadavist)
Comparison: TTE 11/07/19, Limited TTE with contrast 11/21/19

EXAM:
CARDIAC MRI

CLINICAL DATA: Frequent premature ventricular contractions (PVCs)
TECHNIQUE: The patient was scanned on a 1.5 Tesla GE magnet. A dedicated
cardiac coil was used. Functional imaging was done using [employer]
sequences. [DATE], and 4 chamber views were done to assess for RWMA's.
Modified Frank Roger rule using a short axis stack was used to
calculate an ejection fraction on a dedicated work station using
[employer] software. The patient received 10mL GADAVIST GADOBUTROL 1
MMOL/ML IV SOLN. After 10 minutes inversion recovery sequences were
used to assess for infiltration and scar tissue.

[Series 4: t2_haste_db_tra_bh · axial · 8.0mm · 1.41mm/px · 1 of 16 slices shown]
[im 1/16]
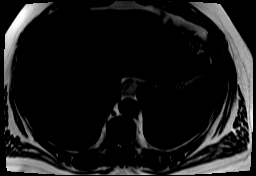

[Series 8: bSSFP · oblique · 8.0mm · 1.61mm/px · 1 of 25 slices shown (1 of 24)]
[im 1/25]
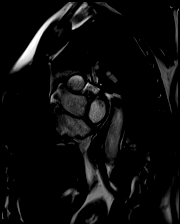

[Series 9: bSSFP · oblique · 8.0mm · 1.79mm/px · 1 of 25 slices shown (2 of 24)]
[im 1/25]
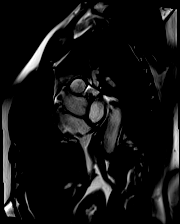

[Series 10: bSSFP · oblique · 8.0mm · 1.79mm/px · 1 of 25 slices shown (3 of 24)]
[im 1/25]
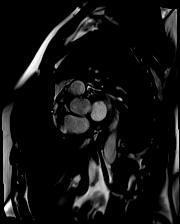

[Series 11: bSSFP · oblique · 8.0mm · 1.79mm/px · 1 of 25 slices shown (4 of 24)]
[im 1/25]
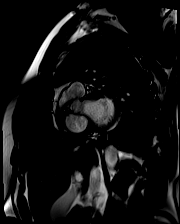

[Series 12: bSSFP · oblique · 8.0mm · 1.79mm/px · 1 of 25 slices shown (5 of 24)]
[im 1/25]
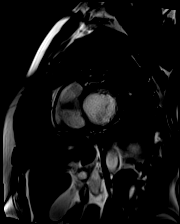

[Series 13: bSSFP · oblique · 8.0mm · 1.79mm/px · 1 of 25 slices shown (6 of 24)]
[im 1/25]
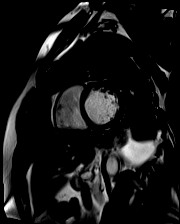

[Series 14: bSSFP · oblique · 8.0mm · 1.79mm/px · 1 of 25 slices shown (7 of 24)]
[im 1/25]
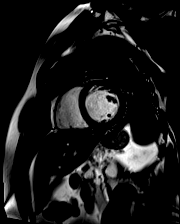

[Series 15: bSSFP · oblique · 8.0mm · 1.79mm/px · 1 of 25 slices shown (8 of 24)]
[im 1/25]
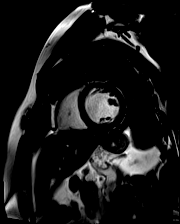

[Series 16: bSSFP · oblique · 8.0mm · 1.79mm/px · 1 of 25 slices shown (9 of 24)]
[im 1/25]
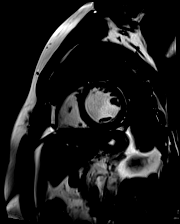

[Series 17: bSSFP · oblique · 8.0mm · 1.79mm/px · 1 of 25 slices shown (10 of 24)]
[im 1/25]
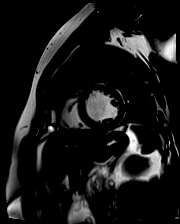

[Series 18: bSSFP · oblique · 8.0mm · 1.79mm/px · 1 of 25 slices shown (11 of 24)]
[im 1/25]
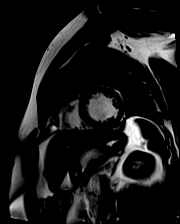

[Series 19: bSSFP · oblique · 8.0mm · 1.79mm/px · 1 of 25 slices shown (12 of 24)]
[im 1/25]
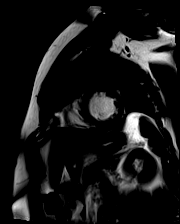

[Series 20: bSSFP · oblique · 8.0mm · 1.79mm/px · 1 of 25 slices shown (13 of 24)]
[im 1/25]
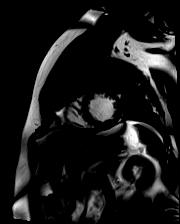

[Series 21: bSSFP · oblique · 8.0mm · 1.79mm/px · 1 of 25 slices shown (14 of 24)]
[im 1/25]
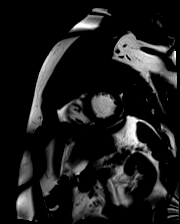

[Series 22: bSSFP · oblique · 8.0mm · 1.79mm/px · 1 of 25 slices shown (15 of 24)]
[im 1/25]
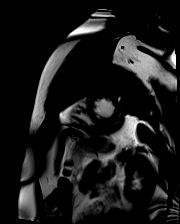

[Series 23: bSSFP · oblique · 8.0mm · 1.79mm/px · 1 of 25 slices shown (16 of 24)]
[im 1/25]
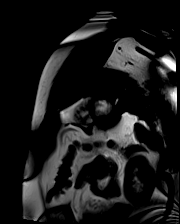

[Series 24: bSSFP · oblique · 8.0mm · 1.79mm/px · 1 of 25 slices shown (17 of 24)]
[im 1/25]
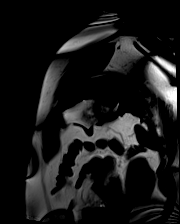

[Series 25: bSSFP · oblique · 8.0mm · 1.79mm/px · 1 of 25 slices shown (18 of 24)]
[im 1/25]
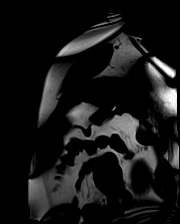

[Series 26: bSSFP · oblique · 8.0mm · 1.79mm/px · 1 of 25 slices shown (19 of 24)]
[im 1/25]
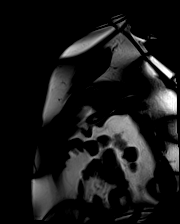

[Series 27: bSSFP · oblique · 8.0mm · 1.79mm/px · 1 of 25 slices shown (20 of 24)]
[im 1/25]
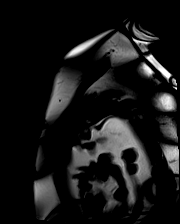

[Series 28: bSSFP · oblique · 6.0mm · 1.41mm/px · 1 of 25 slices shown (21 of 24)]
[im 1/25]
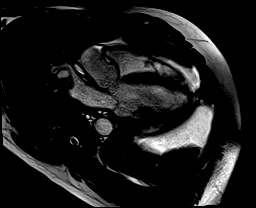

[Series 29: bSSFP · oblique · 6.0mm · 1.41mm/px · 1 of 25 slices shown (22 of 24)]
[im 1/25]
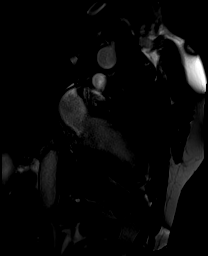

[Series 30: bSSFP · axial · 6.0mm · 1.41mm/px · 1 of 25 slices shown (23 of 24)]
[im 1/25]
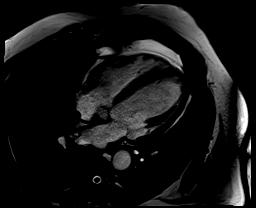

[Series 31: bSSFP · coronal · 6.0mm · 1.41mm/px · 1 of 25 slices shown (24 of 24)]
[im 1/25]
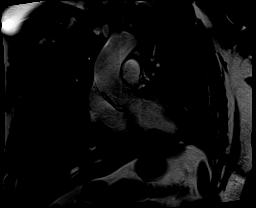

[Series 32: cine rvot · sagittal · 6.0mm · 1.41mm/px · 1 of 25 slices shown]
[im 1/25]
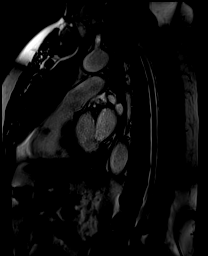

[Series 33: STIR · oblique · 8.0mm · 1.92mm/px · 1 of 17 slices shown]
[im 1/17]
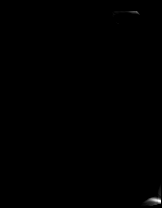

[Series 34: (id)_long_t1 · oblique · 8.0mm · 1.56mm/px · 1 of 24 slices shown]
[im 1/24]
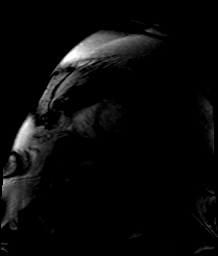

[Series 35: (id)_long_t1_moco · oblique · 8.0mm · 1.56mm/px · 1 of 24 slices shown]
[im 1/24]
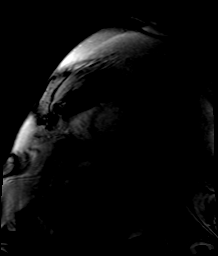

[Series 36: (id)_long_t1_moco_t1 · oblique · 8.0mm · 1.56mm/px · 1 of 6 slices shown]
[im 1/6]
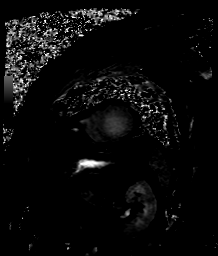

[Series 38: (id)_trufi · oblique · 8.0mm · 2.08mm/px · 1 of 9 slices shown]
[im 1/9]
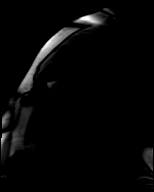

[Series 39: (id)_trufi_moco · oblique · 8.0mm · 2.08mm/px · 1 of 9 slices shown]
[im 1/9]
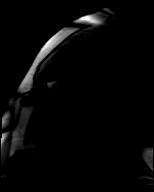

[Series 40: (id)_trufi_moco_t2 · oblique · 8.0mm · 2.08mm/px · 1 of 3 slices shown]
[im 1/3]
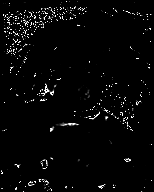

[Series 42: cor rvot · oblique · 6.0mm · 1.41mm/px · 1 of 25 slices shown]
[im 1/25]
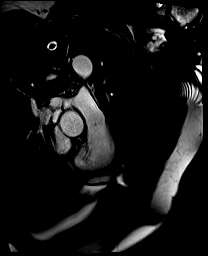

[Series 43: aortic valve cine · oblique · 6.0mm · 1.41mm/px · 1 of 25 slices shown]
[im 1/25]
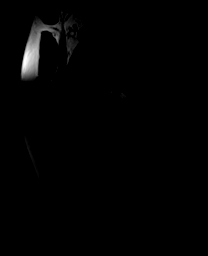

[Series 44: cine rvit · coronal · 6.0mm · 1.41mm/px · 1 of 25 slices shown]
[im 1/25]
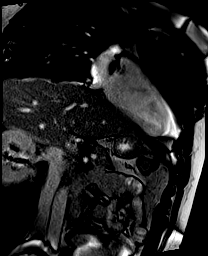

[Series 45: aortic valve flow_200_tp_retro_bh · oblique · 6.0mm · 1.73mm/px · 1 of 30 slices shown]
[im 1/30]
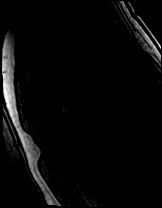

[Series 46: aortic valve flow_200_tp_retro_bh_mag · oblique · 6.0mm · 1.73mm/px · 1 of 30 slices shown]
[im 1/30]
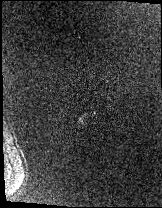

[Series 47: aortic valve flow_200_tp_retro_bh_p · oblique · 6.0mm · 1.73mm/px · 1 of 30 slices shown]
[im 1/30]
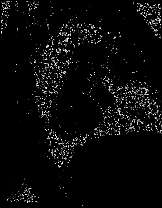

[Series 49: lge_single shot sa · oblique · 8.0mm · 2.14mm/px · 1 of 18 slices shown (1 of 2)]
[im 1/18]
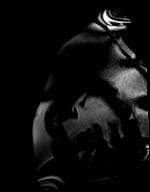

[Series 50: lge_single shot sa · oblique · 8.0mm · 2.14mm/px · 1 of 18 slices shown (2 of 2)]
[im 1/18]
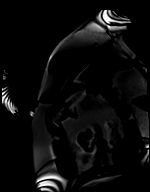

[Series 53: lge_single shot 4 · axial · 6.0mm · 2.08mm/px · 1 of 1 slices shown (1 of 2)]
[im 1/1]
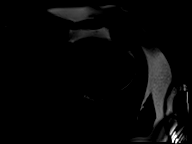

[Series 54: lge_single shot 4 · axial · 6.0mm · 2.08mm/px · 1 of 1 slices shown (2 of 2)]
[im 1/1]
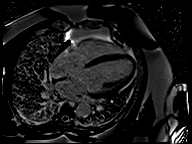

[Series 55: lge_single shot 3 · axial · 6.0mm · 2.08mm/px · 1 of 1 slices shown (1 of 2)]
[im 1/1]
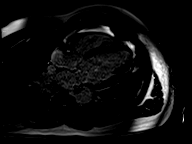

[Series 56: lge_single shot 3 · axial · 6.0mm · 2.08mm/px · 1 of 1 slices shown (2 of 2)]
[im 1/1]
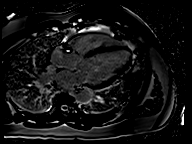

[45 of 48 positions shown; findings below may reference images not displayed]

FINDINGS: LEFT VENTRICLE:

Mild left ventricular chamber enlargement by indexed volume.

Normal left ventricular wall thickness. Maximal wall thickness 9 mm.

Mild-moderately reduced systolic function.

LVEF = 40%

Global hypokinesis with prominent abnormal septal motion - appears
most consistent with abnormal septal activation pattern.

Small focus of post contrast delayed myocardial enhancement at the
inferior RV insertion point, which can be nonspecific or can
represent increased pulmonary pressures.

Normal T1 myocardial nulling kinetics suggest against a diagnosis of
cardiac amyloidosis.

ECV = 24%, normal.

T2 45 msec, no myocardial edema.

RIGHT VENTRICLE:

Normal right ventricular chamber size.

Normal right ventricular wall thickness.

Normal right ventricular systolic function.

RVEF = 53%

There are no regional wall motion abnormalities.

No post contrast delayed myocardial enhancement in the RV.

No evidence of arrhythmogenic cardiomyopathy.

ATRIA:

Normal left atrial size.

Normal right atrial size.

VALVES:

No significant valvular abnormalities.

PERICARDIUM:

Normal pericardium.  No pericardial effusion.

AORTA:

Borderline dilation of mid ascending aorta, 39 mm.

OTHER: No significant extracardiac findings.

Phase contrast not performed due to arrhythmia artifact and patient
not able to complete pulmonary flow phase contrast images.

MEASUREMENTS:
Left ventricle:

LV male

LV EF: 40% (normal 56-78%)

Absolute volumes:

LV EDV: 272 mL (Normal 77-195 mL)

LV ESV: 163 mL (Normal 19-72 mL)

LV SV: 109 mL (Normal 51-133 mL)

CO: 4.6 L/min (Normal 2.8-8.8 L/min)

Indexed volumes:

LV EDV: 118 mL/sq-m (Normal 47-92 mL/sq-m)

LV ESV: 71 mL/sq-m (Normal 13-30 mL/sq-m)

LV SV: 47 mL/sq-m (Normal 32-62 mL/sq-m)

CI: 2.02 L/min/sq-m (Normal 1.7-4.2 L/min/sq-m)

Right ventricle:

RV male

RV EF: 53 % (Normal 47-74%)

Absolute volumes:

RV EDV: 206 mL (Normal 88-227 mL)

RV ESV: 97 mL (Normal 23-103 mL)

RV SV: 109 mL (Normal 52-138 mL)

CO: 4.6  L/min (Normal 2.8-8.8 L/min)

Indexed volumes:

RV EDV: 89 mL/sq-m (Normal 55-105 mL/sq-m)

RV ESV: 42 mL/sq-m (Normal 15-43 mL/sq-m)

RV SV: 47 mL/sq-m (Normal 32-64 mL/sq-m)

CI: 2.01  L/min/sq-m (Normal 1.7-4.2 L/min/sq-m)
IMPRESSION: 1. Mild left ventricular enlargement with mild-moderately reduced
left ventricular systolic function. LVEF 40% by volumes. Prominent
abnormal septal motion, most consistent with abnormal septal
activation pattern.

2. Small focus of post contrast delayed myocardial enhancement at
the inferior RV insertion point, which can be nonspecific or can
represent increased pulmonary pressures.

3. No evidence of scar. No evidence of an inflammatory or
infiltrative process. ECV 24%, normal range. No myocardial edema.

4.  Normal right ventricular chamber size and function, RVEF 53%.

5.  Borderline dilation of mid ascending aorta, 39 mm.

## 2022-05-24 ENCOUNTER — Other Ambulatory Visit: Payer: Self-pay

## 2022-05-24 ENCOUNTER — Telehealth: Payer: Self-pay | Admitting: Medical-Surgical

## 2022-05-24 DIAGNOSIS — I10 Essential (primary) hypertension: Secondary | ICD-10-CM

## 2022-05-24 MED ORDER — AMLODIPINE BESYLATE 2.5 MG PO TABS: 2.5000 mg | ORAL_TABLET | Freq: Every day | ORAL | 0 refills | Status: DC

## 2022-05-24 MED ORDER — AMLODIPINE BESYLATE 5 MG PO TABS: 5.0000 mg | ORAL_TABLET | Freq: Every day | ORAL | 0 refills | Status: DC

## 2022-05-24 NOTE — Progress Notes (Signed)
Please have patient double check that the pharmacy gave him the correct medication. He can take them back to the pharmacy or use an online pill identifier to check the imprint, color, shape, etc. The 5mg  tablets are a bit larger and should be easy to split with a pill splitter. If they are tiny, that does  not sound like the right medication.   I am changing his prescription for ease of use. He will take one tablet of 5mg  daily ALONG WITH one tablet of 2.5mg  daily to equal 7.5mg . No splitting necessary.   ___________________________________________ Thayer Ohm, DNP, APRN, FNP-BC Primary Care and Sports Medicine [facility] [place]

## 2022-05-24 NOTE — Telephone Encounter (Signed)
Patient called office regarding script for amlodipine 5 mg tablet. Patient states the medication looks smaller than normal and he wants to know if he should take 2 pills instead of 1 1/2 pills. Please advise.

## 2022-05-24 NOTE — Telephone Encounter (Signed)
There was a message from earlier today requesting the same information. Here was my response:  Christen Butter, NP at 05/24/2022 11:45 AM  Status: Signed Please have patient double check that the pharmacy gave him the correct medication. He can take them back to the pharmacy or use an online pill identifier to check the imprint, color, shape, etc. The 5mg  tablets are a bit larger and should be easy to split with a pill splitter. If they are tiny, that does  not sound like the right medication.   I am changing his prescription for ease of use. He will take one tablet of 5mg  daily ALONG WITH one tablet of 2.5mg  daily to equal 7.5mg . No splitting necessary.   ___________________________________________ Thayer Ohm, DNP, APRN, FNP-BC Primary Care and Sports Medicine [facility]

## 2022-05-24 NOTE — Progress Notes (Signed)
Patient called wanting to know if he should take two 5 mg because they are too small to cut in half.

## 2022-05-24 NOTE — Progress Notes (Signed)
Attempted to contact patient. Left voice mail message requesting a return call. 

## 2022-05-26 NOTE — Telephone Encounter (Signed)
Pt called back and is aware of prescription changes. Pt is very appreciative.

## 2022-05-26 NOTE — Telephone Encounter (Signed)
Left message on patients voicemail notifying him of medication changes.

## 2022-06-01 NOTE — Progress Notes (Signed)
Patient informed and states will pick up the new prescriptions over the weekend.  He states he verified he is taking the correct medication but that the tablets are small.

## 2022-08-08 ENCOUNTER — Other Ambulatory Visit: Payer: Self-pay

## 2022-08-08 ENCOUNTER — Emergency Department (HOSPITAL_COMMUNITY): Payer: 59

## 2022-08-08 ENCOUNTER — Emergency Department (HOSPITAL_COMMUNITY)
Admission: EM | Admit: 2022-08-08 | Discharge: 2022-08-08 | Disposition: A | Discharge status: Home / Self Care | Payer: 59 | Attending: Emergency Medicine | Admitting: Emergency Medicine

## 2022-08-08 ENCOUNTER — Encounter (HOSPITAL_COMMUNITY): Payer: Self-pay | Admitting: *Deleted

## 2022-08-08 DIAGNOSIS — S161XXA Strain of muscle, fascia and tendon at neck level, initial encounter: Secondary | ICD-10-CM | POA: Insufficient documentation

## 2022-08-08 DIAGNOSIS — I10 Essential (primary) hypertension: Secondary | ICD-10-CM | POA: Diagnosis not present

## 2022-08-08 DIAGNOSIS — Y92410 Unspecified street and highway as the place of occurrence of the external cause: Secondary | ICD-10-CM | POA: Insufficient documentation

## 2022-08-08 DIAGNOSIS — S0083XA Contusion of other part of head, initial encounter: Secondary | ICD-10-CM | POA: Diagnosis not present

## 2022-08-08 DIAGNOSIS — Z79899 Other long term (current) drug therapy: Secondary | ICD-10-CM | POA: Diagnosis not present

## 2022-08-08 DIAGNOSIS — S199XXA Unspecified injury of neck, initial encounter: Secondary | ICD-10-CM | POA: Diagnosis present

## 2022-08-08 DIAGNOSIS — M25522 Pain in left elbow: Secondary | ICD-10-CM | POA: Insufficient documentation

## 2022-08-08 DIAGNOSIS — S0990XA Unspecified injury of head, initial encounter: Secondary | ICD-10-CM

## 2022-08-08 MED ORDER — CYCLOBENZAPRINE HCL 10 MG PO TABS: 10.0000 mg | ORAL_TABLET | Freq: Two times a day (BID) | ORAL | 0 refills | Status: DC | PRN

## 2022-08-08 NOTE — ED Notes (Signed)
Called and no answer x1 
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
## 2022-08-08 NOTE — Discharge Instructions (Addendum)
Take either the prescribed flexeril OR the zanaflex/tixanidine as a muscle relaxant for your pain.  Use lidocaine patch, tylenol, ibuprofen as well for pain.
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
## 2022-08-19 ENCOUNTER — Ambulatory Visit (INDEPENDENT_AMBULATORY_CARE_PROVIDER_SITE_OTHER): Payer: Self-pay | Admitting: Medical-Surgical

## 2022-08-19 ENCOUNTER — Encounter: Payer: Self-pay | Admitting: Medical-Surgical

## 2022-08-19 DIAGNOSIS — M5412 Radiculopathy, cervical region: Secondary | ICD-10-CM

## 2022-08-19 DIAGNOSIS — R202 Paresthesia of skin: Secondary | ICD-10-CM

## 2022-08-19 MED ORDER — PREDNISONE 50 MG PO TABS: 50.0000 mg | ORAL_TABLET | Freq: Every day | ORAL | 0 refills | Status: DC
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
## 2022-08-25 ENCOUNTER — Other Ambulatory Visit: Payer: Self-pay | Admitting: Medical-Surgical

## 2022-08-25 DIAGNOSIS — I10 Essential (primary) hypertension: Secondary | ICD-10-CM

## 2022-08-26 ENCOUNTER — Other Ambulatory Visit: Payer: Self-pay

## 2022-08-26 ENCOUNTER — Ambulatory Visit: Payer: 59 | Attending: Medical-Surgical

## 2022-08-26 DIAGNOSIS — R293 Abnormal posture: Secondary | ICD-10-CM | POA: Insufficient documentation

## 2022-08-26 DIAGNOSIS — M542 Cervicalgia: Secondary | ICD-10-CM | POA: Diagnosis present

## 2022-08-26 DIAGNOSIS — M6281 Muscle weakness (generalized): Secondary | ICD-10-CM | POA: Insufficient documentation

## 2022-09-03 ENCOUNTER — Ambulatory Visit: Payer: 59

## 2022-09-03 DIAGNOSIS — M542 Cervicalgia: Secondary | ICD-10-CM

## 2022-09-03 DIAGNOSIS — M6281 Muscle weakness (generalized): Secondary | ICD-10-CM

## 2022-09-03 DIAGNOSIS — R293 Abnormal posture: Secondary | ICD-10-CM
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
## 2022-09-05 ENCOUNTER — Ambulatory Visit: Payer: No Typology Code available for payment source | Admitting: Medical-Surgical

## 2022-09-10 ENCOUNTER — Ambulatory Visit: Payer: 59

## 2022-09-10 DIAGNOSIS — M542 Cervicalgia: Secondary | ICD-10-CM | POA: Diagnosis not present

## 2022-09-10 DIAGNOSIS — R293 Abnormal posture: Secondary | ICD-10-CM

## 2022-09-10 DIAGNOSIS — M6281 Muscle weakness (generalized): Secondary | ICD-10-CM
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
## 2022-09-14 ENCOUNTER — Telehealth: Payer: Self-pay | Admitting: Medical-Surgical

## 2022-09-14 NOTE — Telephone Encounter (Signed)
Called in for a referral for Neurology. Chiropractor has requested that the patient have this referral.

## 2022-09-16 ENCOUNTER — Ambulatory Visit: Payer: 59 | Admitting: Medical-Surgical

## 2022-09-17 ENCOUNTER — Ambulatory Visit: Payer: 59 | Attending: Medical-Surgical

## 2022-09-17 DIAGNOSIS — M6281 Muscle weakness (generalized): Secondary | ICD-10-CM | POA: Insufficient documentation

## 2022-09-17 DIAGNOSIS — M542 Cervicalgia: Secondary | ICD-10-CM | POA: Diagnosis not present

## 2022-09-17 DIAGNOSIS — R293 Abnormal posture: Secondary | ICD-10-CM | POA: Insufficient documentation
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
## 2022-09-23 ENCOUNTER — Ambulatory Visit: Payer: 59 | Admitting: Medical-Surgical

## 2022-09-24 ENCOUNTER — Ambulatory Visit: Payer: 59

## 2022-09-27 ENCOUNTER — Other Ambulatory Visit: Payer: Self-pay | Admitting: Medical-Surgical

## 2022-10-01 ENCOUNTER — Ambulatory Visit: Payer: 59

## 2022-10-08 ENCOUNTER — Ambulatory Visit: Payer: 59

## 2022-10-11 ENCOUNTER — Telehealth: Payer: Self-pay

## 2022-10-11 NOTE — Telephone Encounter (Signed)
Patient called facility requesting medical records advised to contact HIM gave phone number [phone]  to request. Also advised they are available via MyChart.

## 2022-10-14 ENCOUNTER — Other Ambulatory Visit: Payer: Self-pay | Admitting: Medical-Surgical

## 2022-10-14 DIAGNOSIS — M25461 Effusion, right knee: Secondary | ICD-10-CM

## 2022-11-12 ENCOUNTER — Other Ambulatory Visit: Payer: Self-pay

## 2022-11-12 ENCOUNTER — Ambulatory Visit: Payer: 59 | Attending: Medical-Surgical

## 2022-11-12 DIAGNOSIS — M6281 Muscle weakness (generalized): Secondary | ICD-10-CM | POA: Insufficient documentation

## 2022-11-12 DIAGNOSIS — M542 Cervicalgia: Secondary | ICD-10-CM | POA: Diagnosis present
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
## 2022-12-03 ENCOUNTER — Ambulatory Visit: Payer: 59 | Attending: Medical-Surgical | Admitting: Physical Therapy

## 2022-12-03 ENCOUNTER — Encounter: Payer: Self-pay | Admitting: Physical Therapy

## 2022-12-03 DIAGNOSIS — R293 Abnormal posture: Secondary | ICD-10-CM | POA: Diagnosis present

## 2022-12-03 DIAGNOSIS — M542 Cervicalgia: Secondary | ICD-10-CM | POA: Insufficient documentation

## 2022-12-03 DIAGNOSIS — M6281 Muscle weakness (generalized): Secondary | ICD-10-CM | POA: Diagnosis present

## 2022-12-03 NOTE — Therapy (Signed)
Daily Note   Patient Name: Richard Sexton MRN: [id number] DOB:01-07-1966, 57 y.o., male Today's Date: 12/03/2022  END OF SESSION:  PT End of Session - 12/03/22 0827     Visit Number 2    Number of Visits 9    Date for PT Re-Evaluation 01/07/23    Authorization Type UHC    PT Start Time 0827   pt arrived late   PT Stop Time 0857    PT Time Calculation (min) 30 min    Activity Tolerance Patient tolerated treatment well    Behavior During Therapy [facility] for tasks assessed/performed             Past Medical History:  Diagnosis Date   Allergy    OTC meds PRN    Broken leg 1975   no surgery ,PT was casted - LT leg   ELEVATED BLOOD PRESSURE WITHOUT DIAGNOSIS OF HYPERTENSION 02/16/2009   Qualifier: Diagnosis of  By: Huntley Dec, Scott     GERD (gastroesophageal reflux disease)    occ   Hx of adenomatous polyp of colon 06/2019   Hypertension    Mass of head    right forehead   Past Surgical History:  Procedure Laterality Date   CARPAL TUNNEL RELEASE Bilateral    COLONOSCOPY W/ POLYPECTOMY  06/2019   diminutive adenoma recall 2029   LEG SURGERY Right 1986   Femur shorten   PVC ABLATION N/A 01/31/2020   Procedure: PVC ABLATION;  Surgeon: Lanier Prude, MD;  [facility]: [facility];  Service: Cardiovascular;  Laterality: N/A;   Patient Active Problem List   Diagnosis Date Noted   Hammertoe, bilateral 10/29/2021   Heloma molle 10/29/2021   Bilateral knee swelling 02/04/2020   S4 (fourth heart sound) 10/25/2019   Abnormal finding on EKG 10/25/2019   Essential hypertension 11/19/2017   SMOKER 04/28/2009   Personal history presenting hazards to health 04/28/2009    PCP: Christen Butter, NP  REFERRING PROVIDER: Romero Belling, MD  REFERRING DIAG: Cervicalgia [M54.2]   THERAPY DIAG:  Cervicalgia  Muscle weakness (generalized)  Abnormal posture  Rationale for Evaluation and Treatment: Rehabilitation  ONSET DATE: 08/06/22  SUBJECTIVE:                                                                                                                                                                                                          SUBJECTIVE STATEMENT:  Pt reports that he is having no pain, he reports that he is just having tingling in prolonged positions.  He states that he  does not want to take gabapentin because it makes him tired.  He does not think PT is helpful.   Hand dominance: Right  PERTINENT HISTORY:  PMHx includes HTN, BIL carpal tunnel syndrome, GERD, cocaine abuse, tobacco use  PAIN:  Are you having pain? Yes: NPRS scale: 0/10 Pain location: Lt side neck/shoulder  Pain description: soreness, tingling Aggravating factors: lifting for work (generators)  Relieving factors: none  PRECAUTIONS: None  RED FLAGS: None     WEIGHT BEARING RESTRICTIONS: No  FALLS:  Has patient fallen in last 6 months? No  LIVING ENVIRONMENT: Lives with: lives alone Lives in: House/apartment   PLOF: Independent  PATIENT GOALS: Patient would like to have the tingling to be gone.    OBJECTIVE:   DIAGNOSTIC FINDINGS:  08/08/22 CT cervical spine   IMPRESSION: 1. No acute fracture or traumatic subluxation. 2. Asymmetric left sided uncovertebral spurring contributes to moderate left foraminal stenosis at C3-4.  08/08/22 CT head   IMPRESSION: 1. Left occipital scalp edema without an underlying fracture. 2. Normal CT appearance of the brain.  PATIENT SURVEYS:  FOTO 53 current, 38 predicted   COGNITION: Overall cognitive status: Within functional limits for tasks assessed  SENSATION: Not tested  POSTURE: rounded shoulders and forward head  PALPATION: Moderate pain and "tingling" with C4-5 CPAs and Lt UPA     CERVICAL ROM:   Active ROM A/PROM (deg) 11/12/2022  Flexion 65  Extension 30, p!  Right lateral flexion 30, p!  Left lateral flexion 30, p!  Right rotation 42  Left rotation 40, p!   (Blank rows = not  tested)  UPPER EXTREMITY ROM: grossly WNL bilaterally    UPPER EXTREMITY MMT: Grossly WNL bilaterally   MMT Right 11/12/2022 Left 11/12/2022   Grip strength 80# 72#    CERVICAL SPECIAL TESTS:  Spurling's test: Negative and Distraction test: Negative   TODAY'S TREATMENT:                                                                                                                               [facility] Adult PT Treatment:  Therapeutic Exercise:  UBE 2.5'/2.5' fwd and backward for warm up while taking subjective Supine horizontal abd - GTB - 3x10 Chin tuck in supine - 20x S/L open book - 15x Seated row - 20# - 2x10 with chin tuck  Manual Therapy  Manual chin tuck with PT OP  HOME EXERCISE PROGRAM: Access Code: [email or web address] URL: [email or web address] Date: 11/12/2022 Prepared by: Mauri Reading  Exercises - Seated Cervical Retraction  - 2 x daily - 5 x weekly - 2 sets - 10 reps - 3s hold - Upper Trapezius Stretch  - 2 x daily - 5 x weekly - 1 sets - 2 reps - 30s hold - Shoulder External Rotation and Scapular Retraction with Resistance  - 2 x daily - 5 x weekly - 2 sets - 10 reps - Standing Shoulder Horizontal Abduction with Resistance  -  2 x daily - 5 x weekly - 2 sets - 10 reps  ASSESSMENT: 12/08/22  CLINICAL IMPRESSION: Richard Sexton Maduro tolerated session well with no adverse reaction.  We concentrated on thoracic and cervical mobility with gentle strengthening.  PT states at beginning of session that he does no believe PT will help.  We dicussed the possible benefits of PT including pain relief and reduction in radicular sxs; pt somewhat more receptive to potential benefits following session.  OBJECTIVE IMPAIRMENTS: decreased activity tolerance, decreased ROM, decreased strength, impaired UE functional use, postural dysfunction, and pain.   ACTIVITY LIMITATIONS: carrying, lifting, and sleeping  PARTICIPATION LIMITATIONS: community activity and occupation  PERSONAL  FACTORS: Past/current experiences, Time since onset of injury/illness/exacerbation, and 1-2 comorbidities: PMHx includes HTN, BIL carpal tunnel syndrome, GERD, cocaine abuse, tobacco use  are also affecting patient's functional outcome.   REHAB POTENTIAL: Fair    CLINICAL DECISION MAKING: Evolving/moderate complexity  EVALUATION COMPLEXITY: Moderate   GOALS: Goals reviewed with patient? Yes     SHORT TERM GOALS: Target date: 12/10/2022   Patient will be independent with initial home program for CS mobility, periscapular and grip strengthening.  Baseline: reviewed at eval, to be updated at f/u  Goal status: INITIAL  2.  Patient will demonstrate improved postural awareness for at least 15 minutes while seated without need for cueing from PT.   Baseline: see objective measures  Goal status: INITIAL   3. Patient will demonstrate at least 85-90# grip strength BIL.  Baseline:  MMT Right 11/12/2022 Left 11/12/2022   Grip strength 80# 72#   Goal status: INITIAL   LONG TERM GOALS: Target date: 01/07/2023  Patient will report improved overall functional ability with FOTO score of 70 or greater.  Baseline: 53 Goal status: INITIAL  2.  Patient will demonstrate improved CS AROM as noted below, with minimal to no pain at end range.  Baseline:  Active ROM A/PROM (deg) 11/12/2022 GOAL  Flexion 65 65  Extension 30, p! 35  Right lateral flexion 30, p! 40  Left lateral flexion 30, p! 40  Right rotation 42 45  Left rotation 40, p! 45   Goal status: INITIAL  3.  Patient able to sleep 6 hours uninterrupted Baseline: 3-4 hours Goal status: INITIAL  4.  Patient will report worse pain as 5/10 or less, including during performance of occupational duties.  Baseline: 8/10 Goal status: INITIAL  PLAN:  PT FREQUENCY: 1x/week  PT DURATION: 8 weeks  PLANNED INTERVENTIONS: Therapeutic exercises, Therapeutic activity, Neuromuscular re-education, Patient/Family education, Self Care,  Joint mobilization, Joint manipulation, Dry Needling, Electrical stimulation, Spinal manipulation, Spinal mobilization, Cryotherapy, Moist heat, Taping, Traction, Manual therapy, and Re-evaluation  PLAN FOR NEXT SESSION: manual therapy (CS mobilization, PROM), manual or mechanical cervical traction, self-SNAG for rotation and extension, CS AROM, UT/levator/SCM stretches, periscapular strengthening, other modalities as indicated.    Fredderick Phenix, PT, DPT 12/03/2022, 9:02 AM

## 2022-12-17 ENCOUNTER — Telehealth: Payer: Self-pay | Admitting: Physical Therapy

## 2022-12-17 ENCOUNTER — Ambulatory Visit: Payer: 59 | Attending: Medical-Surgical | Admitting: Physical Therapy

## 2022-12-17 NOTE — Telephone Encounter (Signed)
Called and informed patient of missed visit and provided reminder of next appt and attendance policy VM.  1 visit at a time per attendance policy moving forward.

## 2022-12-24 ENCOUNTER — Ambulatory Visit: Payer: 59 | Admitting: Physical Therapy

## 2022-12-31 ENCOUNTER — Encounter: Payer: 59 | Admitting: Physical Therapy

## 2023-01-01 ENCOUNTER — Other Ambulatory Visit: Payer: Self-pay | Admitting: Medical-Surgical

## 2023-01-07 ENCOUNTER — Encounter: Payer: 59 | Admitting: Physical Therapy

## 2023-01-19 ENCOUNTER — Other Ambulatory Visit: Payer: Self-pay | Admitting: Medical-Surgical

## 2023-01-19 DIAGNOSIS — M25461 Effusion, right knee: Secondary | ICD-10-CM

## 2023-02-25 ENCOUNTER — Other Ambulatory Visit: Payer: Self-pay | Admitting: Medical-Surgical

## 2023-03-07 ENCOUNTER — Other Ambulatory Visit: Payer: Self-pay | Admitting: Medical-Surgical

## 2023-03-07 DIAGNOSIS — I10 Essential (primary) hypertension: Secondary | ICD-10-CM

## 2023-03-07 MED ORDER — LOSARTAN POTASSIUM 50 MG PO TABS: 50.0000 mg | ORAL_TABLET | Freq: Every day | ORAL | 3 refills | Status: DC

## 2023-03-07 NOTE — Telephone Encounter (Signed)
Copied from CRM [id number]. Topic: Clinical - Medication Refill >> Mar 07, 2023 10:36 AM Nila Nephew wrote: Most Recent Primary Care Visit:  Provider: Christen Butter  Department: [facility]  Visit Type: OFFICE VISIT  Date: 08/19/2022  Medication:  losartan (COZAAR) 50 MG tablet   Has the patient contacted their pharmacy? Yes - States an authorization but patient is unwilling to answer questions presented.   Is this the correct pharmacy for this prescription? Yes  This is the patient's preferred pharmacy:    [facility] [address], [place] - [address] [place] [address] Phone: [phone] Fax: [phone]   Has the prescription been filled recently? Yes  Is the patient out of the medication? Yes - on his last pill  Has the patient been seen for an appointment in the last year OR does the patient have an upcoming appointment? Yes  Can we respond through MyChart? No - Phone Call to him or his girlfriend  Agent: Please be advised that Rx refills may take up to 3 business days. We ask that you follow-up with your pharmacy.

## 2023-03-23 ENCOUNTER — Other Ambulatory Visit: Payer: Self-pay | Admitting: Medical-Surgical

## 2023-03-29 ENCOUNTER — Other Ambulatory Visit: Payer: Self-pay | Admitting: Medical-Surgical

## 2023-03-29 DIAGNOSIS — I10 Essential (primary) hypertension: Secondary | ICD-10-CM

## 2023-04-27 ENCOUNTER — Other Ambulatory Visit: Payer: Self-pay | Admitting: Medical-Surgical

## 2023-04-27 NOTE — Telephone Encounter (Signed)
 Copied from CRM [id number]. Topic: Clinical - Medication Refill >> Apr 27, 2023  3:52 PM Nila Nephew wrote: Most Recent Primary Care Visit:  Provider: Christen Butter  Department: [facility]  Visit Type: OFFICE VISIT  Date: 08/19/2022  Medication: amLODipine (NORVASC) 2.5 MG tablet  Has the patient contacted their pharmacy? No  Is this the correct pharmacy for this prescription? Yes If no, delete pharmacy and type the correct one.  This is the patient's preferred pharmacy:   [facility] [address] [place] - [address] [place] [address] Phone: [phone] Fax: [phone]   Has the prescription been filled recently? No  Is the patient out of the medication? Yes  Has the patient been seen for an appointment in the last year OR does the patient have an upcoming appointment? Yes  Can we respond through MyChart? Yes  Agent: Please be advised that Rx refills may take up to 3 business days. We ask that you follow-up with your pharmacy.

## 2023-05-09 ENCOUNTER — Telehealth: Payer: Self-pay

## 2023-05-09 DIAGNOSIS — I10 Essential (primary) hypertension: Secondary | ICD-10-CM

## 2023-05-09 MED ORDER — AMLODIPINE BESYLATE 2.5 MG PO TABS: 2.5000 mg | ORAL_TABLET | Freq: Every day | ORAL | 0 refills | Status: DC

## 2023-05-09 MED ORDER — AMLODIPINE BESYLATE 5 MG PO TABS: 5.0000 mg | ORAL_TABLET | Freq: Every day | ORAL | 0 refills | Status: DC

## 2023-05-09 NOTE — Telephone Encounter (Signed)
 Task completed, #14 for Amlodipine 2.5 mg and 5 mg sent to [facility] located in [place]. Contacted the patient regarding their med refill requests. Per patient, he has confirmed his upcoming appt on 05/18/23. Patient is aware no additional refills will be authorized without an appt. Patient verbalized understanding.

## 2023-05-09 NOTE — Addendum Note (Signed)
 Addended by: Delfino Lovett on: 05/09/2023 05:30 PM   Modules accepted: Orders

## 2023-05-09 NOTE — Telephone Encounter (Signed)
 Copied from CRM [id number]. Topic: Clinical - Medication Question >> May 09, 2023  2:21 PM Antwanette L wrote: Reason for CRM: The patient has an appointment on 4/03 for med refills but the patient will be out of his medicine before his appointment. The patient wants to know if his provider will prescribe a two week supply of amLODipine (NORVASC) 2.5 MG tablet  And amLODipine (NORVASC) 5 MG tablet?. Patient can be contacted by phone at [phone] or girlfriend Lawson Fiscal [phone].  Preferred Pharmacy  [id number] -  [address] [place] [address] Phone: [phone] Fax: [phone] Hours: Not open 24 hours

## 2023-05-10 ENCOUNTER — Ambulatory Visit: Payer: Self-pay | Admitting: Physician Assistant

## 2023-05-17 ENCOUNTER — Other Ambulatory Visit: Payer: Self-pay | Admitting: Medical-Surgical

## 2023-05-17 DIAGNOSIS — M25462 Effusion, left knee: Secondary | ICD-10-CM

## 2023-05-18 ENCOUNTER — Ambulatory Visit: Payer: Self-pay | Admitting: Medical-Surgical

## 2023-05-18 ENCOUNTER — Encounter: Payer: Self-pay | Admitting: Medical-Surgical

## 2023-05-18 VITALS — BP 101/71 | HR 101 | Resp 20 | Ht 68.0 in | Wt 235.1 lb

## 2023-05-18 DIAGNOSIS — E6609 Other obesity due to excess calories: Secondary | ICD-10-CM

## 2023-05-18 DIAGNOSIS — I10 Essential (primary) hypertension: Secondary | ICD-10-CM

## 2023-05-18 DIAGNOSIS — E66812 Obesity, class 2: Secondary | ICD-10-CM

## 2023-05-18 DIAGNOSIS — F17200 Nicotine dependence, unspecified, uncomplicated: Secondary | ICD-10-CM

## 2023-05-18 DIAGNOSIS — Z23 Encounter for immunization: Secondary | ICD-10-CM

## 2023-05-18 DIAGNOSIS — N529 Male erectile dysfunction, unspecified: Secondary | ICD-10-CM

## 2023-05-18 DIAGNOSIS — Z6835 Body mass index (BMI) 35.0-35.9, adult: Secondary | ICD-10-CM

## 2023-05-18 DIAGNOSIS — Z125 Encounter for screening for malignant neoplasm of prostate: Secondary | ICD-10-CM

## 2023-05-18 MED ORDER — AMLODIPINE BESYLATE 2.5 MG PO TABS: 2.5000 mg | ORAL_TABLET | Freq: Every day | ORAL | 3 refills | Status: DC

## 2023-05-18 MED ORDER — LOSARTAN POTASSIUM 50 MG PO TABS: 50.0000 mg | ORAL_TABLET | Freq: Every day | ORAL | 3 refills | Status: DC

## 2023-05-18 MED ORDER — AMLODIPINE BESYLATE 5 MG PO TABS: 5.0000 mg | ORAL_TABLET | Freq: Every day | ORAL | 3 refills | Status: DC

## 2023-05-18 MED ORDER — SILDENAFIL CITRATE 100 MG PO TABS: 50.0000 mg | ORAL_TABLET | Freq: Every day | ORAL | 11 refills | Status: DC | PRN

## 2023-05-18 NOTE — Progress Notes (Unsigned)
   Established patient visit  History, exam, impression, and plan:  No problem-specific Assessment & Plan notes found for this encounter.   ROS  Physical Exam  Procedures performed this visit: None.  No follow-ups on file.  __________________________________ Thayer Ohm, DNP, APRN, FNP-BC Primary Care and Sports Medicine [facility]

## 2023-05-19 ENCOUNTER — Encounter: Payer: Self-pay | Admitting: Medical-Surgical

## 2023-05-19 LAB — CBC WITH DIFFERENTIAL/PLATELET
Basophils Absolute: 0 10*3/uL (ref 0.0–0.2)
Basos: 0 %
EOS (ABSOLUTE): 0.1 10*3/uL (ref 0.0–0.4)
Eos: 2 %
Hematocrit: 48.7 % (ref 37.5–51.0)
Hemoglobin: 16 g/dL (ref 13.0–17.7)
Immature Grans (Abs): 0 10*3/uL (ref 0.0–0.1)
Immature Granulocytes: 0 %
Lymphocytes Absolute: 1.8 10*3/uL (ref 0.7–3.1)
Lymphs: 33 %
MCH: 29.6 pg (ref 26.6–33.0)
MCHC: 32.9 g/dL (ref 31.5–35.7)
MCV: 90 fL (ref 79–97)
Monocytes Absolute: 1 10*3/uL — ABNORMAL HIGH (ref 0.1–0.9)
Monocytes: 18 %
Neutrophils Absolute: 2.5 10*3/uL (ref 1.4–7.0)
Neutrophils: 47 %
Platelets: 167 10*3/uL (ref 150–450)
RBC: 5.41 x10E6/uL (ref 4.14–5.80)
RDW: 14.7 % (ref 11.6–15.4)
WBC: 5.4 10*3/uL (ref 3.4–10.8)

## 2023-05-19 LAB — CMP14+EGFR
ALT: 55 IU/L — ABNORMAL HIGH (ref 0–44)
AST: 47 IU/L — ABNORMAL HIGH (ref 0–40)
Albumin: 4.2 g/dL (ref 3.8–4.9)
Alkaline Phosphatase: 53 IU/L (ref 44–121)
BUN/Creatinine Ratio: 16 (ref 9–20)
BUN: 16 mg/dL (ref 6–24)
Bilirubin Total: 0.3 mg/dL (ref 0.0–1.2)
CO2: 26 mmol/L (ref 20–29)
Calcium: 9.7 mg/dL (ref 8.7–10.2)
Chloride: 101 mmol/L (ref 96–106)
Creatinine, Ser: 1.03 mg/dL (ref 0.76–1.27)
Globulin, Total: 3 g/dL (ref 1.5–4.5)
Glucose: 102 mg/dL — ABNORMAL HIGH (ref 70–99)
Potassium: 4.6 mmol/L (ref 3.5–5.2)
Sodium: 143 mmol/L (ref 134–144)
Total Protein: 7.2 g/dL (ref 6.0–8.5)
eGFR: 85 mL/min/{1.73_m2} (ref >59)

## 2023-05-19 LAB — LIPID PANEL
Chol/HDL Ratio: 3.7 ratio (ref 0.0–5.0)
Cholesterol, Total: 163 mg/dL (ref 100–199)
HDL: 44 mg/dL (ref >39)
LDL Chol Calc (NIH): 90 mg/dL (ref 0–99)
Triglycerides: 165 mg/dL — ABNORMAL HIGH (ref 0–149)
VLDL Cholesterol Cal: 29 mg/dL (ref 5–40)

## 2023-05-19 LAB — PSA TOTAL (REFLEX TO FREE): Prostate Specific Ag, Serum: 5.2 ng/mL — ABNORMAL HIGH (ref 0.0–4.0)

## 2023-05-19 LAB — HEMOGLOBIN A1C
Est. average glucose Bld gHb Est-mCnc: 120 mg/dL
Hgb A1c MFr Bld: 5.8 % — ABNORMAL HIGH (ref 4.8–5.6)

## 2023-05-19 LAB — FPSA% REFLEX
% FREE PSA: 9.6 %
PSA, FREE: 0.5 ng/mL

## 2023-05-26 ENCOUNTER — Other Ambulatory Visit: Payer: Self-pay | Admitting: Medical-Surgical

## 2023-05-26 ENCOUNTER — Telehealth: Payer: Self-pay | Admitting: Medical-Surgical

## 2023-05-26 DIAGNOSIS — M25461 Effusion, right knee: Secondary | ICD-10-CM

## 2023-05-26 NOTE — Telephone Encounter (Signed)
 Copied from CRM [id number]. Topic: Clinical - Medication Question >> May 26, 2023  1:30 PM Dennison Nancy wrote: Reason for CRM: Patient the office with Christen Butter on 05/18/23 and  have question which Medication refills did she send to [facility] [address] [place] - [address] [place]  [place] [address] Phone: [phone] Fax: [phone] Hours: Not open 24 hours  Patient call back number [phone]

## 2023-05-26 NOTE — Telephone Encounter (Signed)
 He also wanted the diclofenac sent to the pharmacy. The prescription has been sent.

## 2023-08-21 ENCOUNTER — Ambulatory Visit: Payer: Self-pay

## 2023-08-21 NOTE — Telephone Encounter (Signed)
 FYI Only or Action Required?: FYI only for provider.  Patient was last seen in primary care on 05/18/2023 by Willo Mini, NP. Called Nurse Triage reporting Numbness. Symptoms began several weeks ago. Interventions attempted: Nothing. Symptoms are: unchanged.  Triage Disposition: See PCP When Office is Open (Within 3 Days)  Patient/caregiver understands and will follow disposition?: Yes  Had carpal tunnel surgery years ago but sx are back and constant. Called Katheryn, pt's friend and scheduled appt for 08/23/23 at 1110, she states if unable to get off work will call back.   Copied from CRM [id number]. Topic: Clinical - Red Word Triage >> Aug 21, 2023  9:47 AM Laurier C wrote: Red Word that prompted transfer to Nurse Triage: Patient has been having some numbness in his right arm for the last 3 weeks. Reason for Disposition  [1] Numbness or tingling in one or both hands AND [2] is a chronic symptom (recurrent or ongoing AND present > 4 weeks)  Answer Assessment - Initial Assessment Questions 1. SYMPTOM: What is the main symptom you are concerned about? (e.g., weakness, numbness)     Numbness and tingling  2. ONSET: When did this start? (minutes, hours, days; while sleeping)     3 weeks  4. PATTERN Does this come and go, or has it been constant since it started?  Is it present now?     constant 6. NEUROLOGIC SYMPTOMS: Have you had any of the following symptoms: headache, dizziness, vision loss, double vision, changes in speech, unsteady on your feet?     no 7. OTHER SYMPTOMS: Do you have any other symptoms?     When turning head to R has sharp pains at times  Protocols used: Neurologic Deficit-A-AH

## 2023-08-22 ENCOUNTER — Ambulatory Visit

## 2023-08-22 ENCOUNTER — Encounter: Payer: Self-pay | Admitting: Medical-Surgical

## 2023-08-22 ENCOUNTER — Ambulatory Visit (INDEPENDENT_AMBULATORY_CARE_PROVIDER_SITE_OTHER): Payer: Self-pay | Admitting: Medical-Surgical

## 2023-08-22 VITALS — BP 118/83 | HR 88 | Resp 20 | Ht 68.0 in | Wt 234.0 lb

## 2023-08-22 DIAGNOSIS — M5412 Radiculopathy, cervical region: Secondary | ICD-10-CM

## 2023-08-22 DIAGNOSIS — I10 Essential (primary) hypertension: Secondary | ICD-10-CM

## 2023-08-22 DIAGNOSIS — G5601 Carpal tunnel syndrome, right upper limb: Secondary | ICD-10-CM

## 2023-08-22 MED ORDER — TIZANIDINE HCL 4 MG PO TABS: 4.0000 mg | ORAL_TABLET | Freq: Three times a day (TID) | ORAL | 0 refills | Status: DC | PRN

## 2023-08-22 MED ORDER — PREDNISONE 50 MG PO TABS: 50.0000 mg | ORAL_TABLET | Freq: Every day | ORAL | 0 refills | Status: DC

## 2023-08-22 NOTE — Progress Notes (Unsigned)
        Established patient visit  History, exam, impression, and plan:  1. LUQ pain Pleasant 59 year old male presenting today with reports of LUQ pain that is described as a dull nagging ache. Feels similar to pain that she has experienced from her gall bladder. Known history of gall stones but has not had her gall bladder removed. LUQ pain is worse with eating fatty, greasy foods. Occasional nausea but no vomiting. No change in bowel habits. No fever/chills. Exam benign with no reproducible tenderness. No HSM. Bowel sounds present throughout. Unclear etiology. Differentials include but not limited to referred pain from gall bladder, splenic flexure syndrome, spleen etiology, pancreatic inflammation, or constipation. Checking labs as below. Getting CT abd/pelvis for further evaluation.  - CBC with Differential/Platelet - CMP14+EGFR - Lipase - Amylase - CT ABDOMEN PELVIS WO CONTRAST; Future  2. Moderate persistent asthma, unspecified whether complicated Hx of asthma currently well controlled. Refilling Flovent  inhaler per patient request.  - fluticasone  (FLOVENT  HFA) 110 MCG/ACT inhaler; INHALE 2 PUFFS INTO THE LUNGS 2 (TWO) TIMES DAILY. FOR COUGH  Dispense: 12 each; Refill: 3   Procedures performed this visit: None.  Return if symptoms worsen or fail to improve.  __________________________________ Richard FREDRIK Palin, DNP, APRN, FNP-BC Primary Care and Sports Medicine [facility] [place]

## 2023-08-23 ENCOUNTER — Ambulatory Visit: Payer: Self-pay | Admitting: Medical-Surgical

## 2023-08-25 ENCOUNTER — Ambulatory Visit: Payer: Self-pay | Admitting: Medical-Surgical

## 2023-09-11 NOTE — Progress Notes (Signed)
 Could you check on the referral for neurosurgery? It doesn't look like an appointment has been made.

## 2023-09-12 ENCOUNTER — Telehealth: Payer: Self-pay | Admitting: Medical-Surgical

## 2023-09-12 ENCOUNTER — Telehealth: Payer: Self-pay

## 2023-09-12 NOTE — Telephone Encounter (Signed)
 Copied from CRM [id number]. Topic: Referral - Status >> Sep 12, 2023  1:41 PM Adrianna P wrote: Reason for CRM: [facility]  said that they are out of network  with patient's insurance.

## 2023-09-12 NOTE — Telephone Encounter (Signed)
 Spoke with patient. Called to give phone number to neurosurgery  per referral sent on 08/22/2023. Patient answered and requested that I call back and leave this information on his voicemail.  Left information on voice mail including as below :  [facility] [address] [place] [address] [phone] And date referral sent 08/22/2023 so patient can call and initiate scheduling

## 2023-09-12 NOTE — Telephone Encounter (Signed)
 Copied from CRM [id number]. Topic: General - Call Back - No Documentation >> Aug 31, 2023 12:52 PM Shamecia H wrote: Reason for CRM: Patient was returning a call no message was left, no notes. >> Sep 12, 2023 10:18 AM Miquel SAILOR wrote: Patient returning office call. Transferred call to office

## 2023-09-19 ENCOUNTER — Ambulatory Visit: Payer: Self-pay | Admitting: Medical-Surgical

## 2023-09-26 ENCOUNTER — Ambulatory Visit (INDEPENDENT_AMBULATORY_CARE_PROVIDER_SITE_OTHER): Payer: Self-pay | Admitting: Medical-Surgical

## 2023-09-26 ENCOUNTER — Encounter: Payer: Self-pay | Admitting: Medical-Surgical

## 2023-09-26 VITALS — BP 113/77 | HR 94 | Resp 20 | Ht 68.0 in | Wt 234.1 lb

## 2023-09-26 DIAGNOSIS — M5412 Radiculopathy, cervical region: Secondary | ICD-10-CM | POA: Diagnosis not present

## 2023-09-26 DIAGNOSIS — I10 Essential (primary) hypertension: Secondary | ICD-10-CM
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
## 2023-09-30 ENCOUNTER — Other Ambulatory Visit

## 2023-10-01 ENCOUNTER — Ambulatory Visit (INDEPENDENT_AMBULATORY_CARE_PROVIDER_SITE_OTHER)

## 2023-10-01 DIAGNOSIS — M5412 Radiculopathy, cervical region: Secondary | ICD-10-CM

## 2023-10-06 ENCOUNTER — Telehealth: Payer: Self-pay | Admitting: Medical-Surgical

## 2023-10-06 NOTE — Telephone Encounter (Signed)
 Copied from CRM [id number]. Topic: Referral - Question >> Oct 06, 2023 10:41 AM Graeme ORN wrote: Reason for CRM: Angeline called from [facility]. Received referral for patient. Patient has [employer] which they do not file. Pt would be Straight self pay there. May want to send the referral somewhere that takes insurance. Thank You

## 2023-10-13 ENCOUNTER — Ambulatory Visit: Payer: Self-pay | Admitting: Medical-Surgical

## 2023-10-17 ENCOUNTER — Telehealth: Payer: Self-pay

## 2023-10-17 NOTE — Telephone Encounter (Signed)
 Per patient, he has not been contacted from the Neurology office to schedule an appointment. Patient is requesting an update. Thanks in advance.

## 2023-10-17 NOTE — Telephone Encounter (Signed)
 Noted

## 2023-11-15 ENCOUNTER — Other Ambulatory Visit: Payer: Self-pay | Admitting: Medical-Surgical

## 2023-11-15 DIAGNOSIS — M25461 Effusion, right knee: Secondary | ICD-10-CM
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
## 2023-11-17 ENCOUNTER — Ambulatory Visit: Payer: Self-pay | Admitting: Medical-Surgical

## 2023-11-20 ENCOUNTER — Ambulatory Visit (INDEPENDENT_AMBULATORY_CARE_PROVIDER_SITE_OTHER): Admitting: Physician Assistant

## 2023-11-20 ENCOUNTER — Ambulatory Visit
Admission: RE | Admit: 2023-11-20 | Discharge: 2023-11-20 | Disposition: A | Discharge status: Home / Self Care | Attending: Physician Assistant | Admitting: Physician Assistant

## 2023-11-20 ENCOUNTER — Ambulatory Visit
Admission: RE | Admit: 2023-11-20 | Discharge: 2023-11-20 | Disposition: A | Discharge status: Home / Self Care | Source: Ambulatory Visit | Attending: Physician Assistant | Admitting: Physician Assistant

## 2023-11-20 ENCOUNTER — Encounter: Payer: Self-pay | Admitting: Physician Assistant

## 2023-11-20 VITALS — BP 120/78 | Ht 68.0 in | Wt 229.0 lb

## 2023-11-20 DIAGNOSIS — M5412 Radiculopathy, cervical region: Secondary | ICD-10-CM

## 2023-11-20 NOTE — Patient Instructions (Signed)
 PT- [phone]

## 2023-11-28 ENCOUNTER — Ambulatory Visit: Payer: Self-pay | Admitting: Medical-Surgical

## 2023-11-28 ENCOUNTER — Other Ambulatory Visit: Payer: Self-pay | Admitting: Medical-Surgical

## 2023-11-28 DIAGNOSIS — M25461 Effusion, right knee: Secondary | ICD-10-CM

## 2023-12-27 ENCOUNTER — Ambulatory Visit: Payer: Self-pay | Admitting: Medical-Surgical

## 2023-12-28 ENCOUNTER — Encounter: Payer: Self-pay | Admitting: Medical-Surgical

## 2023-12-28 ENCOUNTER — Ambulatory Visit (INDEPENDENT_AMBULATORY_CARE_PROVIDER_SITE_OTHER): Payer: Self-pay | Admitting: Medical-Surgical

## 2023-12-28 VITALS — BP 111/78 | HR 88 | Resp 20 | Ht 68.0 in | Wt 231.1 lb

## 2023-12-28 DIAGNOSIS — M5412 Radiculopathy, cervical region: Secondary | ICD-10-CM | POA: Diagnosis not present

## 2023-12-28 DIAGNOSIS — N529 Male erectile dysfunction, unspecified: Secondary | ICD-10-CM

## 2023-12-28 DIAGNOSIS — R972 Elevated prostate specific antigen [PSA]: Secondary | ICD-10-CM

## 2023-12-28 DIAGNOSIS — R7303 Prediabetes: Secondary | ICD-10-CM

## 2023-12-28 DIAGNOSIS — M1712 Unilateral primary osteoarthritis, left knee: Secondary | ICD-10-CM

## 2023-12-28 DIAGNOSIS — I10 Essential (primary) hypertension: Secondary | ICD-10-CM | POA: Diagnosis not present

## 2023-12-28 DIAGNOSIS — F17200 Nicotine dependence, unspecified, uncomplicated: Secondary | ICD-10-CM

## 2023-12-28 MED ORDER — CELECOXIB 200 MG PO CAPS: 200.0000 mg | ORAL_CAPSULE | Freq: Two times a day (BID) | ORAL | 1 refills | Status: AC

## 2023-12-28 MED ORDER — GABAPENTIN 300 MG PO CAPS: 300.0000 mg | ORAL_CAPSULE | Freq: Three times a day (TID) | ORAL | 3 refills | Status: AC

## 2023-12-28 MED ORDER — LOSARTAN POTASSIUM 50 MG PO TABS: 50.0000 mg | ORAL_TABLET | Freq: Every day | ORAL | 3 refills | Status: AC

## 2023-12-28 MED ORDER — AMLODIPINE BESYLATE 2.5 MG PO TABS: 2.5000 mg | ORAL_TABLET | Freq: Every day | ORAL | 3 refills | Status: AC

## 2023-12-28 MED ORDER — AMLODIPINE BESYLATE 5 MG PO TABS: 5.0000 mg | ORAL_TABLET | Freq: Every day | ORAL | 3 refills | Status: AC

## 2023-12-28 MED ORDER — PENNSAID 2 % EX SOLN: 1.0000 | Freq: Two times a day (BID) | CUTANEOUS | 1 refills | Status: AC

## 2023-12-28 MED ORDER — SILDENAFIL CITRATE 100 MG PO TABS: 50.0000 mg | ORAL_TABLET | Freq: Every day | ORAL | 11 refills | Status: AC | PRN

## 2023-12-28 MED ORDER — TIZANIDINE HCL 4 MG PO TABS: 4.0000 mg | ORAL_TABLET | Freq: Three times a day (TID) | ORAL | 3 refills | Status: AC | PRN

## 2023-12-28 NOTE — Progress Notes (Signed)
 Established patient visit   History of Present Illness   Discussed the use of AI scribe software for clinical note transcription with the patient, who gave verbal consent to proceed.  History of Present Illness   Richard Sexton is a 58 year old male who presents with worsening flu symptoms and medication refill issues.  Arthralgia and knee pain - Significant knee discomfort with sensation of bones grinding during ambulation and stair climbing - Previous treatments include rehabilitation, orthopedic consultations, and foam injections with only temporary relief - Uses anti-inflammatory medications including meloxicam , Voltaren , and naproxen  in the past - Currently out of anti-inflammatory medication and open to trying Celebrex - Uses Voltaren  gel (Pennsaid ) and requires a refill through mail-order pharmacy  Hypertension - Takes losartan  50 mg and amlodipine  7.5 mg daily - Blood pressure is well-controlled  Muscle spasms and sleep disturbance - Uses tizanidine  as needed for muscle relaxation - Uses gabapentin occasionally for sleep  Lower urinary tract symptoms - Occasional difficulty initiating urination despite urge - History of previously elevated PSA levels - Due for laboratory work to recheck PSA  Tobacco use - Smokes but has been reducing intake recently  Erectile dysfunction - Uses sildenafil  but is cautious due to privacy concerns with partner   Physical Exam   Physical Exam Vitals and nursing note reviewed.  Constitutional:      General: He is not in acute distress.    Appearance: Normal appearance. He is obese. He is not ill-appearing.  HENT:     Head: Normocephalic and atraumatic.  Cardiovascular:     Rate and Rhythm: Normal rate and regular rhythm.     Pulses: Normal pulses.     Heart sounds: Normal heart sounds. No murmur heard.    No friction rub. No gallop.  Pulmonary:     Effort: Pulmonary effort is normal. No respiratory distress.     Breath  sounds: Normal breath sounds.  Skin:    General: Skin is warm and dry.  Neurological:     Mental Status: He is alert and oriented to person, place, and time.  Psychiatric:        Mood and Affect: Mood normal.        Behavior: Behavior normal.        Thought Content: Thought content normal.        Judgment: Judgment normal.    Assessment & Plan     Unilateral primary osteoarthritis, left knee Chronic left knee osteoarthritis with bone grinding sensation exacerbated by activity. - Referred to orthopedics for further evaluation and management. - Trial Celebrex 200mg  BID prn.  Cervical radiculopathy Confirmed right-sided cervical radiculopathy with prominent spur and foraminal stenosis. Neurosurgery recommended surgical intervention. - Proceed with neurosurgery consultation for surgical intervention. - Trial Celebrex 200mg  BID prn.  Essential hypertension Blood pressure well-controlled with current medication regimen. - Continue current antihypertensive regimen with losartan  50mg  and amlodipine  7.5mg  daily.  Male erectile dysfunction Managed with sildenafil , prefers independent management. - Sent sildenafil  prescription to [facility] pharmacy per patient request.  Elevated prostate specific antigen (PSA) Elevated PSA with occasional urinary hesitancy and slow stream. - Ordered PSA with reflex to free PSA. - If still elevated, refer to Urology.   Tobacco use Continues to smoke with reduced frequency. - Encouraged continued reduction in smoking.  Prediabetes Prior A1c of 5.8% in the prediabetic range. - Rechecking A1c.  Follow up   Return in about 6 months (around 06/26/2024) for chronic disease follow up.  __________________________________ Zada FREDRIK Palin, DNP, APRN, FNP-BC Primary Care and Sports Medicine [facility] [place]

## 2023-12-29 ENCOUNTER — Ambulatory Visit: Payer: Self-pay | Admitting: Medical-Surgical

## 2023-12-29 DIAGNOSIS — R972 Elevated prostate specific antigen [PSA]: Secondary | ICD-10-CM

## 2023-12-29 LAB — HEMOGLOBIN A1C
Est. average glucose Bld gHb Est-mCnc: 117 mg/dL
Hgb A1c MFr Bld: 5.7 % — ABNORMAL HIGH (ref 4.8–5.6)

## 2023-12-29 LAB — PSA TOTAL (REFLEX TO FREE): Prostate Specific Ag, Serum: 7.3 ng/mL — ABNORMAL HIGH (ref 0.0–4.0)

## 2023-12-29 LAB — FPSA% REFLEX
% FREE PSA: 7 %
PSA, FREE: 0.51 ng/mL

## 2024-01-02 ENCOUNTER — Telehealth: Payer: Self-pay

## 2024-01-02 ENCOUNTER — Ambulatory Visit (INDEPENDENT_AMBULATORY_CARE_PROVIDER_SITE_OTHER): Admitting: Orthopaedic Surgery

## 2024-01-02 ENCOUNTER — Other Ambulatory Visit (INDEPENDENT_AMBULATORY_CARE_PROVIDER_SITE_OTHER): Payer: Self-pay

## 2024-01-02 DIAGNOSIS — M1712 Unilateral primary osteoarthritis, left knee: Secondary | ICD-10-CM | POA: Insufficient documentation

## 2024-01-02 NOTE — Telephone Encounter (Signed)
 Patient given surgical clearance form, but also faxed to PCP [facility]. Aware that we must receive clearance form back before being able to proceed with scheduling surgery.

## 2024-01-02 NOTE — Progress Notes (Signed)
 Office Visit Note   Patient: Richard Sexton           Date of Birth: 11-21-1965           MRN: [id number] Visit Date: 01/02/2024              Requested by: Willo Mini, NP [address] [place],  [place] [address] PCP: Willo Mini, NP   Assessment & Plan: Visit Diagnoses:  1. Primary osteoarthritis of left knee     Plan: History of Present Illness Richard Sexton is a 58 year old male who presents with left knee pain and is considering knee replacement surgery.  He experiences significant left knee pain, described as 'crunchy' and similar to bone-on-bone contact. The pain has severely impacted his ability to work, leading to job loss. Cortisone injections in 2023 provided temporary relief, but the pain has returned, affecting his mobility, especially when walking long distances.  He has a history of a cardiac condition treated with an ablation procedure for irregular heartbeat, with no further treatment required since. He smokes three-quarters of a pack of cigarettes a day and denies heavy alcohol use. He mentions a possible nickel allergy, as certain earrings cause itching. He is considering filing for disability due to his knee condition, as he is unable to sustain his livelihood without working.  Physical Exam MUSCULOSKELETAL: Knee with effusion. Knee flexibility normal with pain and crepitus.  Assessment and Plan Left knee primary osteoarthritis Chronic pain and functional limitations due to osteoarthritis. Previous cortisone injections provided temporary relief. Considering knee replacement for improved quality of life and work capabilities. - Schedule knee replacement surgery post-medical clearance. - Provided clearance form for primary care physician. - Use nickel-free implant due to allergy. - Coordinate with surgery scheduler after clearance. - detailed surgical plan discussed including r/b/a to TKA  Follow-Up Instructions: No follow-ups on file.   Orders:   Orders Placed This Encounter  Procedures   XR KNEE 3 VIEW LEFT   No orders of the defined types were placed in this encounter.     Procedures: No procedures performed   Clinical Data: No additional findings.   Subjective: Chief Complaint  Patient presents with   Left Knee - Pain    HPI  Review of Systems  Constitutional: Negative.   HENT: Negative.    Eyes: Negative.   Respiratory: Negative.    Cardiovascular: Negative.   Gastrointestinal: Negative.   Endocrine: Negative.   Genitourinary: Negative.   Skin: Negative.   Allergic/Immunologic: Negative.   Neurological: Negative.   Hematological: Negative.   Psychiatric/Behavioral: Negative.    All other systems reviewed and are negative.    Objective: Vital Signs: There were no vitals taken for this visit.  Physical Exam Vitals and nursing note reviewed.  Constitutional:      Appearance: He is well-developed.  HENT:     Head: Normocephalic and atraumatic.  Eyes:     Pupils: Pupils are equal, round, and reactive to light.  Pulmonary:     Effort: Pulmonary effort is normal.  Abdominal:     Palpations: Abdomen is soft.  Musculoskeletal:        General: Normal range of motion.     Cervical back: Neck supple.  Skin:    General: Skin is warm.  Neurological:     Mental Status: He is alert and oriented to person, place, and time.  Psychiatric:        Behavior: Behavior normal.  Thought Content: Thought content normal.        Judgment: Judgment normal.     Ortho Exam  Specialty Comments:  No specialty comments available.  Imaging: XR KNEE 3 VIEW LEFT Result Date: 01/02/2024 X-rays of the left knee show advanced tricompartmental osteoarthritis.  Bone-on-bone joint space narrowing.  Kellgren-Lawrence stage IV    PMFS History: Patient Active Problem List   Diagnosis Date Noted   Primary osteoarthritis of left knee 01/02/2024   Cervical radiculopathy 09/26/2023   Hammertoe, bilateral  10/29/2021   Heloma molle 10/29/2021   Bilateral knee swelling 02/04/2020   S4 (fourth heart sound) 10/25/2019   Abnormal finding on EKG 10/25/2019   Essential hypertension 11/19/2017   SMOKER 04/28/2009   Personal history presenting hazards to health 04/28/2009   Past Medical History:  Diagnosis Date   Allergy    OTC meds PRN    Broken leg 1975   no surgery ,PT was casted - LT leg   ELEVATED BLOOD PRESSURE WITHOUT DIAGNOSIS OF HYPERTENSION 02/16/2009   Qualifier: Diagnosis of  By: Lelon RIGGERS, Scott     GERD (gastroesophageal reflux disease)    occ   Hx of adenomatous polyp of colon 06/2019   Hypertension    Mass of head    right forehead    Family History  Problem Relation Age of Onset   Diabetes Mother    Hypertension Mother    Colon cancer Neg Hx    Colon polyps Neg Hx    Esophageal cancer Neg Hx    Rectal cancer Neg Hx    Stomach cancer Neg Hx     Past Surgical History:  Procedure Laterality Date   CARPAL TUNNEL RELEASE Bilateral    COLONOSCOPY W/ POLYPECTOMY  06/2019   diminutive adenoma recall 2029   LEG SURGERY Right 1986   Femur shorten   PVC ABLATION N/A 01/31/2020   Procedure: PVC ABLATION;  Surgeon: Cindie Ole DASEN, MD;  Location: [facility];  Service: Cardiovascular;  Laterality: N/A;   Social History   Occupational History   Occupation: cook  Tobacco Use   Smoking status: Every Day    Current packs/day: 0.75    Average packs/day: 0.8 packs/day for 8.0 years (6.0 ttl pk-yrs)    Types: Cigarettes   Smokeless tobacco: Never  Vaping Use   Vaping status: Never Used  Substance and Sexual Activity   Alcohol use: Yes    Comment: 1-2 drinks/week, beer or liquor   Drug use: No   Sexual activity: Yes    Partners: Female

## 2024-01-30 NOTE — Progress Notes (Unsigned)
 Chief Complaint: Abnormal PSA test   History of Present Illness: 58 yo ,male is here for E/M of elevated PSA. Previous available PSA data: 11.13.2025--PSA 7.0, % free 7 4.3.2025--PSA 5.2, % free 9.6 3.5.2021--3.4 9.20.2019--0.9  He denies significant urologic history.  He has been treated for a couple of UTIs in the past.  He has seen Dr. Cindie with [facility] cardiology, the last time about 4 years ago.  He has had a PVC ablation.  He has not followed up as scheduled.  He does have a heart rate of 100 today.  Otherwise asymptomatic.   Past Medical History:  Past Medical History:  Diagnosis Date   Allergy    OTC meds PRN    Broken leg 1975   no surgery ,PT was casted - LT leg   ELEVATED BLOOD PRESSURE WITHOUT DIAGNOSIS OF HYPERTENSION 02/16/2009   Qualifier: Diagnosis of  By: Lelon RIGGERS, Scott     GERD (gastroesophageal reflux disease)    occ   Hx of adenomatous polyp of colon 06/2019   Hypertension    Mass of head    right forehead    Past Surgical History:  Past Surgical History:  Procedure Laterality Date   CARPAL TUNNEL RELEASE Bilateral    COLONOSCOPY W/ POLYPECTOMY  06/2019   diminutive adenoma recall 2029   LEG SURGERY Right 1986   Femur shorten   PVC ABLATION N/A 01/31/2020   Procedure: PVC ABLATION;  Surgeon: Cindie Ole DASEN, MD;  Location: [facility];  Service: Cardiovascular;  Laterality: N/A;    Allergies:  Allergies[1]  Family History:  Family History  Problem Relation Age of Onset   Diabetes Mother    Hypertension Mother    Colon cancer Neg Hx    Colon polyps Neg Hx    Esophageal cancer Neg Hx    Rectal cancer Neg Hx    Stomach cancer Neg Hx     Social History:  Social History[2]  Review of symptoms:  Constitutional:  Negative for unexplained weight loss, night sweats, fever, chills ENT:  Negative for nose bleeds, sinus pain, painful swallowing CV:  Negative for chest pain, shortness of breath, exercise intolerance,  palpitations, loss of consciousness Resp:  Negative for cough, wheezing, shortness of breath GI:  Negative for nausea, vomiting, diarrhea, bloody stools GU:  Positives noted in HPI; otherwise negative for gross hematuria, dysuria, urinary incontinence Neuro:  Negative for seizures, poor balance, limb weakness, slurred speech Psych:  Negative for lack of energy, depression, anxiety Endocrine:  Negative for polydipsia, polyuria, symptoms of hypoglycemia (dizziness, hunger, sweating) Hematologic:  Negative for anemia, purpura, petechia, prolonged or excessive bleeding, use of anticoagulants  Allergic:  Negative for difficulty breathing or choking as a result of exposure to anything; no shellfish allergy; no allergic response (rash/itch) to materials, foods  Physical exam: There were no vitals taken for this visit. GENERAL APPEARANCE:  Well appearing, well developed, well nourished, NAD HEENT: Atraumatic, Normocephalic. NECK: Normal appearance LUNGS: Normal inspiratory and expiratory excursion HEART: Regular Rate ABDOMEN: Obese, no inguinal hernias GU: Phallus normal, no lesions. Scrotal skin normal. Testicles/epididymal structures normal. Meatus normal. Normal anal sphincter tone, prostate 30 mL, symmetric, non nodular, non tender. EXTREMITIES: Moves all extremities well.  Without clubbing, cyanosis, or edema. NEUROLOGIC:  Alert and oriented x 3, normal gait, CN II-XII grossly intact.  MENTAL STATUS:  Appropriate. SKIN:  Warm, dry and intact.    Results:  I have reviewed referring/prior physicians notes  I have reviewed  urinalysis--clear  I have reviewed PSA results--these were reviewed with the patient  I have reviewed prior imaging--CT images from 2021 reviewed.  Estimated prostate volume 40 mL.  I have reviewed urine culture results  Assessment: 1.  Elevated PSA.  700% increase over the past 6 years.  Benign exam, fairly normal prostate volume on CT scan 4 years ago  2.   Tachycardia.  The patient does have a history of arrhythmia.  He does need to follow-up with his cardiologist   Plan: 1.  I would recommend proceeding with ultrasound and biopsy.  I discussed the procedure as well as risks and complications with the patient.  We will get that scheduled in the near future  2.  Referral to Dr. Cindie     [1]  Allergies Allergen Reactions   Bactrim [Sulfamethoxazole-Trimethoprim] Palpitations  [2]  Social History Tobacco Use   Smoking status: Every Day    Current packs/day: 0.75    Average packs/day: 0.8 packs/day for 8.0 years (6.0 ttl pk-yrs)    Types: Cigarettes   Smokeless tobacco: Never  Vaping Use   Vaping status: Never Used  Substance Use Topics   Alcohol use: Yes    Comment: 1-2 drinks/week, beer or liquor   Drug use: No

## 2024-01-31 ENCOUNTER — Ambulatory Visit: Admitting: Urology

## 2024-01-31 VITALS — BP 126/76 | HR 111 | Ht 67.0 in | Wt 229.0 lb

## 2024-01-31 DIAGNOSIS — R000 Tachycardia, unspecified: Secondary | ICD-10-CM

## 2024-01-31 DIAGNOSIS — R972 Elevated prostate specific antigen [PSA]: Secondary | ICD-10-CM | POA: Diagnosis not present

## 2024-01-31 LAB — MICROSCOPIC EXAMINATION

## 2024-01-31 LAB — URINALYSIS, ROUTINE W REFLEX MICROSCOPIC
Bilirubin, UA: NEGATIVE
Glucose, UA: NEGATIVE
Leukocytes,UA: NEGATIVE
Nitrite, UA: NEGATIVE
Specific Gravity, UA: 1.02 (ref 1.005–1.030)
Urobilinogen, Ur: 1 mg/dL (ref 0.2–1.0)
pH, UA: 6 (ref 5.0–7.5)

## 2024-01-31 LAB — BLADDER SCAN AMB NON-IMAGING

## 2024-01-31 NOTE — Addendum Note (Signed)
 Addended by: OBADIAH ROSELEE RAMAN on: 01/31/2024 03:06 PM   Modules accepted: Orders

## 2024-02-28 ENCOUNTER — Other Ambulatory Visit: Admitting: Urology

## 2024-03-20 ENCOUNTER — Telehealth: Payer: Self-pay

## 2024-03-20 NOTE — Telephone Encounter (Signed)
 Hello Brooke,   Our office has tried to reach this patient multiple times with no response. We are closing the referral at this time. Please let us  know If the patient may need us  in the future.   Best,  Doyal Fridge, CMA

## 2024-04-03 ENCOUNTER — Other Ambulatory Visit: Admitting: Urology

## 2024-04-03 ENCOUNTER — Ambulatory Visit (HOSPITAL_BASED_OUTPATIENT_CLINIC_OR_DEPARTMENT_OTHER): Payer: Self-pay

## 2024-06-27 ENCOUNTER — Ambulatory Visit: Admitting: Medical-Surgical
